# Patient Record
Sex: Female | Born: 1965 | Race: White | Hispanic: No | Smoking: Current some day smoker
Health system: Southern US, Community
[De-identification: ages and names within clinical notes are randomized; demographics above are authoritative.]

## PROBLEM LIST (undated history)

## (undated) DIAGNOSIS — F329 Major depressive disorder, single episode, unspecified: Secondary | ICD-10-CM

## (undated) DIAGNOSIS — F17200 Nicotine dependence, unspecified, uncomplicated: Secondary | ICD-10-CM

## (undated) DIAGNOSIS — Z87442 Personal history of urinary calculi: Secondary | ICD-10-CM

## (undated) DIAGNOSIS — F32A Depression, unspecified: Secondary | ICD-10-CM

## (undated) DIAGNOSIS — F419 Anxiety disorder, unspecified: Secondary | ICD-10-CM

## (undated) DIAGNOSIS — J189 Pneumonia, unspecified organism: Secondary | ICD-10-CM

## (undated) DIAGNOSIS — C801 Malignant (primary) neoplasm, unspecified: Secondary | ICD-10-CM

---
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
## 1998-08-09 ENCOUNTER — Other Ambulatory Visit: Admission: RE | Admit: 1998-08-09 | Discharge: 1998-08-09 | Payer: Self-pay | Admitting: Obstetrics & Gynecology

## 1999-08-20 ENCOUNTER — Other Ambulatory Visit: Admission: RE | Admit: 1999-08-20 | Discharge: 1999-08-20 | Payer: Self-pay | Admitting: Obstetrics & Gynecology

## 2000-09-30 ENCOUNTER — Other Ambulatory Visit: Admission: RE | Admit: 2000-09-30 | Discharge: 2000-09-30 | Payer: Self-pay | Admitting: Obstetrics & Gynecology

## 2001-11-27 ENCOUNTER — Other Ambulatory Visit: Admission: RE | Admit: 2001-11-27 | Discharge: 2001-11-27 | Payer: Self-pay | Admitting: Obstetrics & Gynecology

## 2002-12-07 ENCOUNTER — Other Ambulatory Visit: Admission: RE | Admit: 2002-12-07 | Discharge: 2002-12-07 | Payer: Self-pay | Admitting: Obstetrics & Gynecology

## 2004-01-18 ENCOUNTER — Other Ambulatory Visit: Admission: RE | Admit: 2004-01-18 | Discharge: 2004-01-18 | Payer: Self-pay | Admitting: Specialist

## 2004-01-18 ENCOUNTER — Other Ambulatory Visit: Admission: RE | Admit: 2004-01-18 | Discharge: 2004-01-18 | Payer: Self-pay | Admitting: Obstetrics & Gynecology

## 2004-07-26 ENCOUNTER — Other Ambulatory Visit: Admission: RE | Admit: 2004-07-26 | Discharge: 2004-07-26 | Payer: Self-pay | Admitting: Obstetrics & Gynecology

## 2005-03-12 ENCOUNTER — Other Ambulatory Visit: Admission: RE | Admit: 2005-03-12 | Discharge: 2005-03-12 | Payer: Self-pay | Admitting: Obstetrics & Gynecology

## 2006-11-18 HISTORY — PX: ABDOMINAL HYSTERECTOMY: SHX81

## 2006-11-18 HISTORY — PX: CHOLECYSTECTOMY: SHX55

## 2009-01-31 ENCOUNTER — Ambulatory Visit: Payer: Self-pay | Admitting: Psychology

## 2009-02-21 ENCOUNTER — Ambulatory Visit: Payer: Self-pay | Admitting: Psychology

## 2009-11-24 ENCOUNTER — Encounter: Admission: RE | Admit: 2009-11-24 | Discharge: 2009-11-24 | Payer: Self-pay | Admitting: Obstetrics & Gynecology

## 2010-05-15 ENCOUNTER — Ambulatory Visit (HOSPITAL_COMMUNITY): Admission: RE | Admit: 2010-05-15 | Discharge: 2010-05-15 | Payer: Self-pay | Admitting: Endocrinology

## 2011-02-03 LAB — BASIC METABOLIC PANEL
BUN: 5 mg/dL — ABNORMAL LOW (ref 6–23)
Calcium: 8.7 mg/dL (ref 8.4–10.5)
Chloride: 103 mEq/L (ref 96–112)
Creatinine, Ser: 0.65 mg/dL (ref 0.4–1.2)
Glucose, Bld: 93 mg/dL (ref 70–99)
Potassium: 3.6 mEq/L (ref 3.5–5.1)
Sodium: 138 mEq/L (ref 135–145)

## 2011-02-03 LAB — ACTH STIMULATION, 3 TIME POINTS
Cortisol, 30 Min: 13.8 ug/dL (ref >20)
Cortisol, 60 Min: 15.6 ug/dL (ref >20)

## 2012-01-14 ENCOUNTER — Other Ambulatory Visit: Payer: Self-pay | Admitting: Obstetrics & Gynecology

## 2012-01-14 DIAGNOSIS — R928 Other abnormal and inconclusive findings on diagnostic imaging of breast: Secondary | ICD-10-CM

## 2012-01-15 ENCOUNTER — Other Ambulatory Visit: Payer: Self-pay

## 2017-07-16 ENCOUNTER — Other Ambulatory Visit: Payer: Self-pay | Admitting: Obstetrics & Gynecology

## 2017-07-16 DIAGNOSIS — R928 Other abnormal and inconclusive findings on diagnostic imaging of breast: Secondary | ICD-10-CM

## 2017-07-18 ENCOUNTER — Ambulatory Visit
Admission: RE | Admit: 2017-07-18 | Discharge: 2017-07-18 | Disposition: A | Discharge status: Home / Self Care | Payer: Commercial Managed Care - PPO | Source: Ambulatory Visit | Attending: Obstetrics & Gynecology | Admitting: Obstetrics & Gynecology

## 2017-07-18 ENCOUNTER — Other Ambulatory Visit: Payer: Self-pay | Admitting: Obstetrics & Gynecology

## 2017-07-18 DIAGNOSIS — R928 Other abnormal and inconclusive findings on diagnostic imaging of breast: Secondary | ICD-10-CM

## 2017-08-15 DIAGNOSIS — Z170 Estrogen receptor positive status [ER+]: Secondary | ICD-10-CM | POA: Diagnosis not present

## 2017-08-15 DIAGNOSIS — C50419 Malignant neoplasm of upper-outer quadrant of unspecified female breast: Secondary | ICD-10-CM | POA: Insufficient documentation

## 2017-08-15 DIAGNOSIS — C50911 Malignant neoplasm of unspecified site of right female breast: Secondary | ICD-10-CM | POA: Diagnosis not present

## 2017-08-20 ENCOUNTER — Other Ambulatory Visit: Payer: Self-pay | Admitting: Surgery

## 2017-08-20 DIAGNOSIS — Z853 Personal history of malignant neoplasm of breast: Secondary | ICD-10-CM

## 2017-09-24 ENCOUNTER — Ambulatory Visit: Payer: Self-pay | Admitting: Plastic Surgery

## 2017-09-24 NOTE — H&P (Signed)
Jasmine [name] is an 51 y.o. female.   Chief Complaint: RIGHT breast cancer HPI: The patient is a 51 y.o. yrs old wf here for RIGHT breast reconstruction consult.  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 She underwent a lap choley in the past and is a smoker.  She has mild ptosis bilaterally.  The patient is 5 feet 2 inches tall, weight 137 pounds and preop bra = 36 D.   No past medical history on file.  No past surgical history on file.  Family History  Problem Relation Age of Onset  . Breast cancer Paternal Grandmother    Social History:  has no tobacco, alcohol, and drug history on file.  Allergies: Not on File  No medications prior to admission.    No results found for this or any previous visit (from the past 48 hour(s)). No results found.  Review of Systems  Constitutional: Negative.   HENT: Negative.   Eyes: Negative.   Respiratory: Negative.   Cardiovascular: Negative.   Gastrointestinal: Negative.  Negative for heartburn.  Genitourinary: Negative.   Musculoskeletal: Negative.   Skin: Negative.   Neurological: Negative.   Psychiatric/Behavioral: Negative.     There were no vitals taken for this visit. Physical Exam  Constitutional: She is oriented to person, place, and time. She appears well-developed and well-nourished.  HENT:  Head: Normocephalic and atraumatic.  Eyes: EOM are normal. Pupils are equal, round, and reactive to light.  Cardiovascular: Normal rate.  Respiratory: Effort normal.  GI: Soft. She exhibits no distension.  Neurological: She is alert and oriented to person, place, and time.  Skin: Skin is warm.  Psychiatric: She has a normal mood and affect. Her behavior is normal. Judgment and thought content normal.     Assessment/Plan Plan for right partial mastectomy with bilateral reduction for symmetry and contour.  Wallace Going,  DO 09/24/2017, 9:26 AM

## 2017-09-25 ENCOUNTER — Other Ambulatory Visit: Payer: Self-pay

## 2017-09-25 ENCOUNTER — Encounter (HOSPITAL_BASED_OUTPATIENT_CLINIC_OR_DEPARTMENT_OTHER): Payer: Self-pay | Admitting: *Deleted

## 2017-09-29 NOTE — Progress Notes (Signed)
Ensure pre surgery drink given with instructions to complete by [facility], hibiclens soap given with instructions, pt verbalized understanding.

## 2017-09-30 ENCOUNTER — Ambulatory Visit: Payer: Self-pay | Admitting: Plastic Surgery

## 2017-09-30 ENCOUNTER — Ambulatory Visit: Payer: Self-pay | Admitting: Physician Assistant

## 2017-09-30 DIAGNOSIS — C50919 Malignant neoplasm of unspecified site of unspecified female breast: Secondary | ICD-10-CM

## 2017-09-30 NOTE — Anesthesia Preprocedure Evaluation (Addendum)
Anesthesia Evaluation  Patient identified by MRN, date of birth, ID band Patient awake    Reviewed: Allergy & Precautions, NPO status , Patient's Chart, lab work & pertinent test results  Airway Mallampati: II  TM Distance: >3 FB Neck ROM: Full    Dental  (+) Dental Advisory Given   Pulmonary Current Smoker,    breath sounds clear to auscultation       Cardiovascular negative cardio ROS   Rhythm:Regular Rate:Normal     Neuro/Psych negative neurological ROS     GI/Hepatic negative GI ROS, Neg liver ROS,   Endo/Other  negative endocrine ROS  Renal/GU negative Renal ROS     Musculoskeletal   Abdominal   Peds  Hematology negative hematology ROS (+)   Anesthesia Other Findings   Reproductive/Obstetrics                           No results found for: WBC, HGB, HCT, MCV, PLT  Anesthesia Physical Anesthesia Plan  ASA: II  Anesthesia Plan: General   Post-op Pain Management:  Regional for Post-op pain   Induction: Intravenous  PONV Risk Score and Plan: 2 and Dexamethasone, Ondansetron and Treatment may vary due to age or medical condition  Airway Management Planned: Oral ETT  Additional Equipment:   Intra-op Plan:   Post-operative Plan: Extubation in OR  Informed Consent: I have reviewed the patients History and Physical, chart, labs and discussed the procedure including the risks, benefits and alternatives for the proposed anesthesia with the patient or authorized representative who has indicated his/her understanding and acceptance.   Dental advisory given  Plan Discussed with: CRNA  Anesthesia Plan Comments:        Anesthesia Quick Evaluation

## 2017-09-30 NOTE — H&P (Signed)
Jasmine [name] is an 51 y.o. female.   Chief Complaint: right breast cancer  HPI: this is a 51 year old with the recent diagnosis of a right breast cancer.  She was initially seen by my partner in [place] but after she requested bilateral mastectomies, she was transferred to my care and Dr. Marla Roe was asked to see the patient for immediate reconstruction.  She was found to have a right breast cancer on imaging and biopsy with extension to the nipple.  She has requested her other breast be removed.  She has no prior history of breast cancer.  She is otherwise well and without complaints  Past Medical History:  Diagnosis Date  . Anxiety     Past Surgical History:  Procedure Laterality Date  . ABDOMINAL HYSTERECTOMY  2008  . CHOLECYSTECTOMY  2008  . WISDOM TOOTH EXTRACTION  1984    Family History  Problem Relation Age of Onset  . Breast cancer Paternal Grandmother    Social History:  reports that she has been smoking.  She has been smoking about 1.00 pack per day. she has never used smokeless tobacco. She reports that she does not drink alcohol or use drugs.  Allergies: No Known Allergies  No medications prior to admission.    No results found for this or any previous visit (from the past 48 hour(s)). No results found.  Review of Systems  All other systems reviewed and are negative.   Height 5\' 2"  (1.575 m), weight 63.5 kg (140 lb). Physical Exam  Constitutional: She is oriented to person, place, and time. She appears well-developed and well-nourished.  HENT:  Head: Normocephalic.  Right Ear: External ear normal.  Left Ear: External ear normal.  Eyes: Conjunctivae are normal. Pupils are equal, round, and reactive to light. No scleral icterus.  Neck: Normal range of motion. Neck supple.  Cardiovascular: Normal rate, regular rhythm, normal heart sounds and intact distal pulses.  No murmur heard. Respiratory: Effort normal and breath sounds normal. She exhibits no  tenderness.  GI: Soft. Bowel sounds are normal. There is no tenderness.  Musculoskeletal: Normal range of motion. She exhibits no edema.  Neurological: She is alert and oriented to person, place, and time.  Skin: Skin is warm. She is not diaphoretic. No erythema.  Psychiatric: Her behavior is normal. Judgment normal.   breast:  No palpable masses  Assessment/Plan Right breast cancer  We will proceed with a right breast mastectomy and right deep axillary sentinel lymph node biopsy as well as a left mastectomy.  She will also undergo immediate reconstruction by Plastic Surgery.  We discussed the risks which include but are not limited to bleeding, infection, the need for further surgery if nodes are positive, injury to surrounding structures, DVT, post op recovery, etc.  She agrees to proceed.  Harl Bowie, MD 09/30/2017, 5:55 PM

## 2017-10-01 ENCOUNTER — Encounter (HOSPITAL_BASED_OUTPATIENT_CLINIC_OR_DEPARTMENT_OTHER)
Admission: RE | Disposition: A | Discharge status: Home / Self Care | Payer: Self-pay | Source: Ambulatory Visit | Attending: Plastic Surgery

## 2017-10-01 ENCOUNTER — Ambulatory Visit (HOSPITAL_BASED_OUTPATIENT_CLINIC_OR_DEPARTMENT_OTHER): Payer: Commercial Managed Care - PPO | Admitting: Certified Registered"

## 2017-10-01 ENCOUNTER — Ambulatory Visit (HOSPITAL_BASED_OUTPATIENT_CLINIC_OR_DEPARTMENT_OTHER)
Admission: RE | Admit: 2017-10-01 | Discharge: 2017-10-02 | Disposition: A | Discharge status: Home / Self Care | Payer: Commercial Managed Care - PPO | Source: Ambulatory Visit | Attending: Plastic Surgery | Admitting: Plastic Surgery

## 2017-10-01 ENCOUNTER — Encounter (HOSPITAL_BASED_OUTPATIENT_CLINIC_OR_DEPARTMENT_OTHER): Payer: Self-pay

## 2017-10-01 ENCOUNTER — Other Ambulatory Visit: Payer: Self-pay

## 2017-10-01 ENCOUNTER — Ambulatory Visit (HOSPITAL_COMMUNITY)
Admission: RE | Admit: 2017-10-01 | Discharge: 2017-10-01 | Disposition: A | Discharge status: Home / Self Care | Payer: Commercial Managed Care - PPO | Source: Ambulatory Visit | Attending: Surgery | Admitting: Surgery

## 2017-10-01 DIAGNOSIS — F419 Anxiety disorder, unspecified: Secondary | ICD-10-CM | POA: Diagnosis not present

## 2017-10-01 DIAGNOSIS — Z9013 Acquired absence of bilateral breasts and nipples: Secondary | ICD-10-CM | POA: Insufficient documentation

## 2017-10-01 DIAGNOSIS — F17210 Nicotine dependence, cigarettes, uncomplicated: Secondary | ICD-10-CM | POA: Insufficient documentation

## 2017-10-01 DIAGNOSIS — Z853 Personal history of malignant neoplasm of breast: Secondary | ICD-10-CM

## 2017-10-01 DIAGNOSIS — C50911 Malignant neoplasm of unspecified site of right female breast: Secondary | ICD-10-CM | POA: Diagnosis not present

## 2017-10-01 DIAGNOSIS — Z170 Estrogen receptor positive status [ER+]: Secondary | ICD-10-CM | POA: Diagnosis not present

## 2017-10-01 DIAGNOSIS — C50919 Malignant neoplasm of unspecified site of unspecified female breast: Secondary | ICD-10-CM

## 2017-10-01 HISTORY — PX: MASTECTOMY W/ SENTINEL NODE BIOPSY: SHX2001

## 2017-10-01 HISTORY — DX: Anxiety disorder, unspecified: F41.9

## 2017-10-01 HISTORY — PX: BREAST RECONSTRUCTION WITH PLACEMENT OF TISSUE EXPANDER AND FLEX HD (ACELLULAR HYDRATED DERMIS): SHX6295

## 2017-10-01 HISTORY — PX: SIMPLE MASTECTOMY WITH AXILLARY SENTINEL NODE BIOPSY: SHX6098

## 2017-10-01 SURGERY — MASTECTOMY WITH SENTINEL LYMPH NODE BIOPSY
Anesthesia: General | Site: Breast | Laterality: Right

## 2017-10-01 MED ORDER — BUPIVACAINE-EPINEPHRINE (PF) 0.5% -1:200000 IJ SOLN
INTRAMUSCULAR | Status: AC
  Filled 2017-10-01: qty 30

## 2017-10-01 MED ORDER — CEFAZOLIN SODIUM-DEXTROSE 2-4 GM/100ML-% IV SOLN: 2.0000 g | INTRAVENOUS | Status: DC

## 2017-10-01 MED ORDER — FENTANYL CITRATE (PF) 100 MCG/2ML IJ SOLN
INTRAMUSCULAR | Status: AC
  Filled 2017-10-01: qty 2

## 2017-10-01 MED ORDER — LACTATED RINGERS IV SOLN
INTRAVENOUS | Status: DC
  Administered 2017-10-01 (×2): via INTRAVENOUS

## 2017-10-01 MED ORDER — SENNA 8.6 MG PO TABS
1.0000 | ORAL_TABLET | Freq: Two times a day (BID) | ORAL | Status: DC
  Administered 2017-10-01: 8.6 mg via ORAL
  Filled 2017-10-01: qty 1

## 2017-10-01 MED ORDER — FENTANYL CITRATE (PF) 100 MCG/2ML IJ SOLN
25.0000 ug | INTRAMUSCULAR | Status: DC | PRN
  Administered 2017-10-01 (×2): 50 ug via INTRAVENOUS

## 2017-10-01 MED ORDER — SUCCINYLCHOLINE CHLORIDE 200 MG/10ML IV SOSY
PREFILLED_SYRINGE | INTRAVENOUS | Status: AC
  Filled 2017-10-01: qty 10

## 2017-10-01 MED ORDER — ONDANSETRON 4 MG PO TBDP: 4.0000 mg | ORAL_TABLET | Freq: Four times a day (QID) | ORAL | Status: DC | PRN

## 2017-10-01 MED ORDER — CHLORHEXIDINE GLUCONATE CLOTH 2 % EX PADS: 6.0000 | MEDICATED_PAD | Freq: Once | CUTANEOUS | Status: DC

## 2017-10-01 MED ORDER — DEXAMETHASONE SODIUM PHOSPHATE 10 MG/ML IJ SOLN
INTRAMUSCULAR | Status: AC
  Filled 2017-10-01: qty 1

## 2017-10-01 MED ORDER — SUGAMMADEX SODIUM 200 MG/2ML IV SOLN
INTRAVENOUS | Status: AC
  Filled 2017-10-01: qty 2

## 2017-10-01 MED ORDER — MIDAZOLAM HCL 2 MG/2ML IJ SOLN
INTRAMUSCULAR | Status: AC
  Filled 2017-10-01: qty 2

## 2017-10-01 MED ORDER — HYDROCODONE-ACETAMINOPHEN 5-325 MG PO TABS
1.0000 | ORAL_TABLET | ORAL | Status: DC | PRN
  Administered 2017-10-01 (×4): 1 via ORAL
  Administered 2017-10-02 (×2): 2 via ORAL
  Filled 2017-10-01: qty 2
  Filled 2017-10-01: qty 1
  Filled 2017-10-01 (×2): qty 2
  Filled 2017-10-01: qty 1

## 2017-10-01 MED ORDER — BUPIVACAINE-EPINEPHRINE (PF) 0.25% -1:200000 IJ SOLN
INTRAMUSCULAR | Status: DC | PRN
  Administered 2017-10-01 (×2): 30 mL

## 2017-10-01 MED ORDER — METHYLENE BLUE 0.5 % INJ SOLN
INTRAVENOUS | Status: AC
  Filled 2017-10-01: qty 10

## 2017-10-01 MED ORDER — KCL IN DEXTROSE-NACL 20-5-0.45 MEQ/L-%-% IV SOLN
INTRAVENOUS | Status: DC
  Administered 2017-10-01: 16:00:00 via INTRAVENOUS
  Filled 2017-10-01: qty 1000

## 2017-10-01 MED ORDER — SCOPOLAMINE 1 MG/3DAYS TD PT72: 1.0000 | MEDICATED_PATCH | Freq: Once | TRANSDERMAL | Status: DC | PRN

## 2017-10-01 MED ORDER — LIDOCAINE 2% (20 MG/ML) 5 ML SYRINGE
INTRAMUSCULAR | Status: DC | PRN
  Administered 2017-10-01: 20 mg via INTRAVENOUS

## 2017-10-01 MED ORDER — ONDANSETRON HCL 4 MG/2ML IJ SOLN: 4.0000 mg | Freq: Four times a day (QID) | INTRAMUSCULAR | Status: DC | PRN

## 2017-10-01 MED ORDER — CEFAZOLIN SODIUM-DEXTROSE 2-4 GM/100ML-% IV SOLN
2.0000 g | Freq: Three times a day (TID) | INTRAVENOUS | Status: DC
  Administered 2017-10-01 – 2017-10-02 (×2): 2 g via INTRAVENOUS
  Filled 2017-10-01 (×2): qty 100

## 2017-10-01 MED ORDER — ONDANSETRON HCL 4 MG/2ML IJ SOLN
INTRAMUSCULAR | Status: DC | PRN
  Administered 2017-10-01: 4 mg via INTRAVENOUS

## 2017-10-01 MED ORDER — ONDANSETRON HCL 4 MG/2ML IJ SOLN
INTRAMUSCULAR | Status: AC
  Filled 2017-10-01: qty 2

## 2017-10-01 MED ORDER — PROPOFOL 10 MG/ML IV BOLUS
INTRAVENOUS | Status: DC | PRN
  Administered 2017-10-01: 50 mg via INTRAVENOUS
  Administered 2017-10-01: 150 mg via INTRAVENOUS

## 2017-10-01 MED ORDER — SUFENTANIL CITRATE 50 MCG/ML IV SOLN
INTRAVENOUS | Status: AC
  Filled 2017-10-01: qty 1

## 2017-10-01 MED ORDER — POLYETHYLENE GLYCOL 3350 17 G PO PACK: 17.0000 g | PACK | Freq: Every day | ORAL | Status: DC | PRN

## 2017-10-01 MED ORDER — ROCURONIUM BROMIDE 10 MG/ML (PF) SYRINGE
PREFILLED_SYRINGE | INTRAVENOUS | Status: AC
  Filled 2017-10-01: qty 5

## 2017-10-01 MED ORDER — HYDROMORPHONE HCL 1 MG/ML IJ SOLN: 1.0000 mg | INTRAMUSCULAR | Status: DC | PRN

## 2017-10-01 MED ORDER — MIDAZOLAM HCL 2 MG/2ML IJ SOLN
1.0000 mg | INTRAMUSCULAR | Status: DC | PRN
  Administered 2017-10-01 (×2): 2 mg via INTRAVENOUS

## 2017-10-01 MED ORDER — ALPRAZOLAM 0.5 MG PO TABS: 0.5000 mg | ORAL_TABLET | Freq: Every evening | ORAL | Status: DC | PRN

## 2017-10-01 MED ORDER — EPHEDRINE SULFATE 50 MG/ML IJ SOLN
INTRAMUSCULAR | Status: DC | PRN
  Administered 2017-10-01: 10 mg via INTRAVENOUS
  Administered 2017-10-01: 50 mg via INTRAVENOUS

## 2017-10-01 MED ORDER — TECHNETIUM TC 99M SULFUR COLLOID FILTERED
1.0000 | Freq: Once | INTRAVENOUS | Status: AC | PRN
  Administered 2017-10-01: 1 via INTRADERMAL

## 2017-10-01 MED ORDER — DIPHENHYDRAMINE HCL 12.5 MG/5ML PO ELIX: 12.5000 mg | ORAL_SOLUTION | Freq: Four times a day (QID) | ORAL | Status: DC | PRN

## 2017-10-01 MED ORDER — FENTANYL CITRATE (PF) 100 MCG/2ML IJ SOLN
50.0000 ug | INTRAMUSCULAR | Status: AC | PRN
  Administered 2017-10-01 (×4): 50 ug via INTRAVENOUS

## 2017-10-01 MED ORDER — PROMETHAZINE HCL 25 MG/ML IJ SOLN: 6.2500 mg | INTRAMUSCULAR | Status: DC | PRN

## 2017-10-01 MED ORDER — SUCCINYLCHOLINE CHLORIDE 20 MG/ML IJ SOLN
INTRAMUSCULAR | Status: DC | PRN
  Administered 2017-10-01: 80 mg via INTRAVENOUS

## 2017-10-01 MED ORDER — METHYLENE BLUE 0.5 % INJ SOLN
INTRAVENOUS | Status: DC | PRN
  Administered 2017-10-01: 2 mL via SUBMUCOSAL

## 2017-10-01 MED ORDER — NAPROXEN 500 MG PO TABS: 500.0000 mg | ORAL_TABLET | Freq: Two times a day (BID) | ORAL | Status: DC | PRN

## 2017-10-01 MED ORDER — LIDOCAINE 2% (20 MG/ML) 5 ML SYRINGE
INTRAMUSCULAR | Status: AC
  Filled 2017-10-01: qty 5

## 2017-10-01 MED ORDER — DIAZEPAM 2 MG PO TABS
2.0000 mg | ORAL_TABLET | Freq: Two times a day (BID) | ORAL | Status: DC | PRN
  Administered 2017-10-01: 2 mg via ORAL
  Filled 2017-10-01: qty 1

## 2017-10-01 MED ORDER — PHENYLEPHRINE 40 MCG/ML (10ML) SYRINGE FOR IV PUSH (FOR BLOOD PRESSURE SUPPORT)
PREFILLED_SYRINGE | INTRAVENOUS | Status: AC
  Filled 2017-10-01: qty 10

## 2017-10-01 MED ORDER — SODIUM CHLORIDE 0.9 % IV SOLN
INTRAVENOUS | Status: DC | PRN
  Administered 2017-10-01: 500 mL

## 2017-10-01 MED ORDER — DEXAMETHASONE SODIUM PHOSPHATE 4 MG/ML IJ SOLN
INTRAMUSCULAR | Status: DC | PRN
  Administered 2017-10-01: 10 mg via INTRAVENOUS

## 2017-10-01 MED ORDER — SODIUM CHLORIDE 0.9 % IJ SOLN
INTRAMUSCULAR | Status: AC
  Filled 2017-10-01: qty 10

## 2017-10-01 MED ORDER — DIPHENHYDRAMINE HCL 50 MG/ML IJ SOLN: 12.5000 mg | Freq: Four times a day (QID) | INTRAMUSCULAR | Status: DC | PRN

## 2017-10-01 MED ORDER — CEFAZOLIN SODIUM-DEXTROSE 2-4 GM/100ML-% IV SOLN
2.0000 g | INTRAVENOUS | Status: AC
  Administered 2017-10-01: 2 g via INTRAVENOUS

## 2017-10-01 MED ORDER — MIDAZOLAM HCL 2 MG/2ML IJ SOLN
INTRAMUSCULAR | Status: AC
Start: 2017-10-01 — End: 2017-10-01
  Filled 2017-10-01: qty 2

## 2017-10-01 MED ORDER — SODIUM CHLORIDE 0.9 % IJ SOLN
INTRAMUSCULAR | Status: DC | PRN
  Administered 2017-10-01: 3 mL via INTRAVENOUS

## 2017-10-01 MED ORDER — CEFAZOLIN SODIUM-DEXTROSE 2-4 GM/100ML-% IV SOLN
INTRAVENOUS | Status: AC
  Filled 2017-10-01: qty 100

## 2017-10-01 MED ORDER — EPHEDRINE 5 MG/ML INJ
INTRAVENOUS | Status: AC
  Filled 2017-10-01: qty 10

## 2017-10-01 SURGICAL SUPPLY — 79 items
APPLIER CLIP 9.375 MED OPEN (MISCELLANEOUS) ×6
BAG DECANTER FOR FLEXI CONT (MISCELLANEOUS) ×6 IMPLANT
BINDER BREAST LRG (GAUZE/BANDAGES/DRESSINGS) ×6 IMPLANT
BINDER BREAST MEDIUM (GAUZE/BANDAGES/DRESSINGS) IMPLANT
BINDER BREAST XLRG (GAUZE/BANDAGES/DRESSINGS) IMPLANT
BINDER BREAST XXLRG (GAUZE/BANDAGES/DRESSINGS) IMPLANT
BIOPATCH RED 1 DISK 7.0 (GAUZE/BANDAGES/DRESSINGS) ×5 IMPLANT
BIOPATCH RED 1IN DISK 7.0MM (GAUZE/BANDAGES/DRESSINGS) ×1
BLADE CLIPPER SURG (BLADE) IMPLANT
BLADE HEX COATED 2.75 (ELECTRODE) ×6 IMPLANT
BLADE SURG 15 STRL LF DISP TIS (BLADE) ×4 IMPLANT
BLADE SURG 15 STRL SS (BLADE) ×3
BNDG GAUZE ELAST 4 BULKY (GAUZE/BANDAGES/DRESSINGS) ×12 IMPLANT
CANISTER SUCT 1200ML W/VALVE (MISCELLANEOUS) ×6 IMPLANT
CHLORAPREP W/TINT 26ML (MISCELLANEOUS) ×6 IMPLANT
CLIP APPLIE 9.375 MED OPEN (MISCELLANEOUS) ×4 IMPLANT
COVER BACK TABLE 60X90IN (DRAPES) ×6 IMPLANT
COVER MAYO STAND STRL (DRAPES) ×6 IMPLANT
COVER PROBE W GEL 5X96 (DRAPES) ×6 IMPLANT
DECANTER SPIKE VIAL GLASS SM (MISCELLANEOUS) ×6 IMPLANT
DERMABOND ADVANCED (GAUZE/BANDAGES/DRESSINGS) ×4
DERMABOND ADVANCED .7 DNX12 (GAUZE/BANDAGES/DRESSINGS) ×8 IMPLANT
DRAIN CHANNEL 19F RND (DRAIN) ×6 IMPLANT
DRAPE LAPAROSCOPIC ABDOMINAL (DRAPES) ×6 IMPLANT
DRAPE UTILITY XL STRL (DRAPES) ×6 IMPLANT
DRSG PAD ABDOMINAL 8X10 ST (GAUZE/BANDAGES/DRESSINGS) ×12 IMPLANT
ELECT BLADE 4.0 EZ CLEAN MEGAD (MISCELLANEOUS) ×6
ELECT REM PT RETURN 9FT ADLT (ELECTROSURGICAL) ×6
ELECTRODE BLDE 4.0 EZ CLN MEGD (MISCELLANEOUS) ×4 IMPLANT
ELECTRODE REM PT RTRN 9FT ADLT (ELECTROSURGICAL) ×4 IMPLANT
EVACUATOR SILICONE 100CC (DRAIN) ×6 IMPLANT
GAUZE SPONGE 4X4 12PLY STRL LF (GAUZE/BANDAGES/DRESSINGS) IMPLANT
GLOVE BIO SURGEON STRL SZ 6.5 (GLOVE) ×10 IMPLANT
GLOVE BIO SURGEONS STRL SZ 6.5 (GLOVE) ×2
GLOVE SURG SIGNA 7.5 PF LTX (GLOVE) ×6 IMPLANT
GOWN STRL REUS W/ TWL LRG LVL3 (GOWN DISPOSABLE) ×12 IMPLANT
GOWN STRL REUS W/ TWL XL LVL3 (GOWN DISPOSABLE) ×4 IMPLANT
GOWN STRL REUS W/TWL LRG LVL3 (GOWN DISPOSABLE) ×6
GOWN STRL REUS W/TWL XL LVL3 (GOWN DISPOSABLE) ×3
GRAFT FLEX HD 4X16 THICK (Tissue Mesh) ×12 IMPLANT
HEMOSTAT SURGICEL 2X14 (HEMOSTASIS) IMPLANT
IMPL EXPANDER BREAST 375CC (Breast) ×8 IMPLANT
IMPLANT BREAST 375CC (Breast) ×4 IMPLANT
IMPLANT EXPANDER BREAST 375CC (Breast) ×8 IMPLANT
IV NS 1000ML (IV SOLUTION)
IV NS 1000ML BAXH (IV SOLUTION) IMPLANT
IV NS 500ML (IV SOLUTION) ×2
IV NS 500ML BAXH (IV SOLUTION) ×4 IMPLANT
KIT FILL SYSTEM UNIVERSAL (SET/KITS/TRAYS/PACK) IMPLANT
NDL SAFETY ECLIPSE 18X1.5 (NEEDLE) ×4 IMPLANT
NEEDLE HYPO 18GX1.5 SHARP (NEEDLE) ×2
NEEDLE HYPO 25X1 1.5 SAFETY (NEEDLE) ×12 IMPLANT
NS IRRIG 1000ML POUR BTL (IV SOLUTION) ×6 IMPLANT
PACK BASIN DAY SURGERY FS (CUSTOM PROCEDURE TRAY) ×6 IMPLANT
PENCIL BUTTON HOLSTER BLD 10FT (ELECTRODE) ×12 IMPLANT
PIN SAFETY STERILE (MISCELLANEOUS) ×6 IMPLANT
SET ASEPTIC TRANSFER (MISCELLANEOUS) ×12 IMPLANT
SLEEVE SCD COMPRESS KNEE MED (MISCELLANEOUS) ×6 IMPLANT
SPONGE LAP 18X18 X RAY DECT (DISPOSABLE) ×12 IMPLANT
SUT ETHILON 3 0 PS 1 (SUTURE) ×6 IMPLANT
SUT MNCRL AB 4-0 PS2 18 (SUTURE) ×6 IMPLANT
SUT MON AB 3-0 SH 27 (SUTURE) ×4
SUT MON AB 3-0 SH27 (SUTURE) ×4 IMPLANT
SUT MON AB 5-0 PS2 18 (SUTURE) ×12 IMPLANT
SUT PDS 3-0 CT2 (SUTURE)
SUT PDS AB 2-0 CT2 27 (SUTURE) ×18 IMPLANT
SUT PDS II 3-0 CT2 27 ABS (SUTURE) IMPLANT
SUT SILK 3 0 PS 1 (SUTURE) ×6 IMPLANT
SUT VIC AB 3-0 SH 27 (SUTURE) ×3
SUT VIC AB 3-0 SH 27X BRD (SUTURE) ×4 IMPLANT
SYR BULB 3OZ (MISCELLANEOUS) ×6 IMPLANT
SYR BULB IRRIGATION 50ML (SYRINGE) ×6 IMPLANT
SYR CONTROL 10ML LL (SYRINGE) ×12 IMPLANT
TOWEL OR 17X24 6PK STRL BLUE (TOWEL DISPOSABLE) ×12 IMPLANT
TOWEL OR NON WOVEN STRL DISP B (DISPOSABLE) ×6 IMPLANT
TUBE CONNECTING 20'X1/4 (TUBING) ×1
TUBE CONNECTING 20X1/4 (TUBING) ×5 IMPLANT
UNDERPAD 30X30 (UNDERPADS AND DIAPERS) ×12 IMPLANT
YANKAUER SUCT BULB TIP NO VENT (SUCTIONS) ×12 IMPLANT

## 2017-10-01 NOTE — Interval H&P Note (Signed)
History and Physical Interval Note:  10/01/2017 11:36 AM  Jasmine [name]  has presented today for surgery, with the diagnosis of RIGHT BREAST CANCER  The various methods of treatment have been discussed with the patient and family. After consideration of risks, benefits and other options for treatment, the patient has consented to  Procedure(s): RIGHT  MASTECTOMY WITH RIGHT SENTINEL LYMPH NODE BIOPSY (Right) LEFT NIPPLE SPARING MASTECTOMY (Left) BILATERAL BREAST RECONSTRUCTION WITH PLACEMENT OF TISSUE EXPANDER AND FLEX HD (ACELLULAR HYDRATED DERMIS) (Bilateral) as a surgical intervention .  The patient's history has been reviewed, patient examined, no change in status, stable for surgery.  I have reviewed the patient's chart and labs.  Questions were answered to the patient's satisfaction.     Wallace Going

## 2017-10-01 NOTE — Op Note (Signed)
Op report    DATE OF OPERATION:  10/01/2017  LOCATION: [place]  SURGICAL DIVISION: Plastic Surgery  PREOPERATIVE DIAGNOSES:  1. RIGHT Breast cancer.    POSTOPERATIVE DIAGNOSES:  1. RIGHT Breast cancer.   PROCEDURE:  1. Bilateral immediate breast reconstruction with placement of Acellular Dermal Matrix and tissue expanders.  SURGEON: Pantera Winterrowd Sanger Benjamine Strout, DO  ANESTHESIA:  General.   COMPLICATIONS: None.   IMPLANTS: Left - Mentor 375cc. Ref # [id number].  Serial Number [id number]; 200 cc saline injected Right - Mentor 375cc. Ref # [id number].  Serial Number [id number]; 200 cc saline injected Acellular Dermal Matrix 4 x 16 cm two  INDICATIONS FOR PROCEDURE:  The patient, Jasmine [name], is a 51 y.o. female born on 1966/10/06, is here for  immediate first stage breast reconstruction with placement of bilateral tissue expander and Acellular dermal matrix. MRN: [id number]  CONSENT:  Informed consent was obtained directly from the patient. Risks, benefits and alternatives were fully discussed. Specific risks including but not limited to bleeding, infection, hematoma, seroma, scarring, pain, implant infection, implant extrusion, capsular contracture, asymmetry, wound healing problems, and need for further surgery were all discussed. The patient did have an ample opportunity to have her questions answered to her satisfaction.   DESCRIPTION OF PROCEDURE:  The patient was taken to the operating room by the general surgery team. SCDs were placed and IV antibiotics were given. The patient's chest was prepped and draped in a sterile fashion. A time out was performed and the implants to be used were identified.  Bilateral mastectomies were performed.  Once the general surgery team had completed their portion of the case the patient was rendered to the plastic and reconstructive surgery team.  RIght:  The pectoralis major muscle was lifted from the chest wall  with release of the lateral edge and lateral inframammary fold.  The pocket was irrigated with antibiotic solution and hemostasis was achieved with electrocautery.  The ADM was then prepared according to the manufacture guidelines and slits placed to help with postoperative fluid management.  The ADM was then sutured to the inferior and lateral edge of the inframammary fold with 2-0 PDS starting with an interrupted stitch and then a running stitch.  The lateral portion was sutured to with interrupted sutures after the expander was placed.  The expander was prepared according to the manufacture guidelines, the air evacuated and then it was placed under the ADM and pectoralis major muscle.  The inferior and lateral tabs were used to secure the expander to the chest wall with 2-0 PDS.  The drain was placed at the inframammary fold over the ADM and secured to the skin with 3-0 Silk.  The deep layers were closed with 3-0 Monocryl followed by 4-0 Monocryl.  The skin was closed with 5-0 Monocryl and then dermabond was applied.   Left: The pectoralis major muscle was lifted from the chest wall with release of the lateral edge and lateral inframammary fold.  The superior flap was particularly thick and therefore 1 cm of fat thickness excised and sent.  The pocket was irrigated with antibiotic solution and hemostasis was achieved with electrocautery.  The ADM was then prepared according to the manufacture guidelines and slits placed to help with postoperative fluid management.  The ADM was then sutured to the inferior and lateral edge of the inframammary fold with 2-0 PDS starting with an interrupted stitch and then a running stitch.  The lateral portion was sutured to with interrupted sutures  after the expander was placed.  The expander was prepared according to the manufacture guidelines, the air evacuated and then it was placed under the ADM and pectoralis major muscle.  The inferior and lateral tabs were used to secure  the expander to the chest wall with 2-0 PDS.  The drain was placed at the inframammary fold over the ADM and secured to the skin with 3-0 Silk.  The deep layers were closed with 3-0 Monocryl followed by 4-0 Monocryl.  The skin was closed with 5-0 Monocryl and then dermabond was applied.   The ABDs and breast binder were placed.  The patient tolerated the procedure well and there were no complications.  The patient was allowed to wake from anesthesia and taken to the recovery room in satisfactory condition.

## 2017-10-01 NOTE — Anesthesia Procedure Notes (Signed)
Anesthesia Regional Block: Pectoralis block   Pre-Anesthetic Checklist: ,, timeout performed, Correct Patient, Correct Site, Correct Laterality, Correct Procedure, Correct Position, site marked, Risks and benefits discussed,  Surgical consent,  Pre-op evaluation,  At surgeon's request and post-op pain management  Laterality: Right  Prep: chloraprep       Needles:  Injection technique: Single-shot  Needle Type: Echogenic Needle     Needle Length: 9cm  Needle Gauge: 21     Additional Needles:   Procedures:,,,, ultrasound used (permanent image in chart),,,,  Narrative:  Start time: 10/01/2017 10:10 AM End time: 10/01/2017 10:16 AM Injection made incrementally with aspirations every 5 mL.  Performed by: Personally  Anesthesiologist: Suzette Battiest, MD

## 2017-10-01 NOTE — Interval H&P Note (Signed)
History and Physical Interval Note:no change in H and P  10/01/2017 10:03 AM  Jasmine [name]  has presented today for surgery, with the diagnosis of RIGHT BREAST CANCER  The various methods of treatment have been discussed with the patient and family. After consideration of risks, benefits and other options for treatment, the patient has consented to  Procedure(s): RIGHT  MASTECTOMY WITH RIGHT SENTINEL LYMPH NODE BIOPSY (Right) LEFT NIPPLE SPARING MASTECTOMY (Left) BILATERAL BREAST RECONSTRUCTION WITH PLACEMENT OF TISSUE EXPANDER AND FLEX HD (ACELLULAR HYDRATED DERMIS) (Bilateral) as a surgical intervention .  The patient's history has been reviewed, patient examined, no change in status, stable for surgery.  I have reviewed the patient's chart and labs.  Questions were answered to the patient's satisfaction.     Elijio Staples A

## 2017-10-01 NOTE — Transfer of Care (Signed)
Immediate Anesthesia Transfer of Care Note  Patient: Jasmine [name]  Procedure(s) Performed: RIGHT  MASTECTOMY WITH RIGHT SENTINEL LYMPH NODE BIOPSY (Right Breast) Left Mastectomy (Left Breast) BILATERAL BREAST RECONSTRUCTION WITH PLACEMENT OF TISSUE EXPANDER AND FLEX HD (ACELLULAR HYDRATED DERMIS) (Bilateral Breast)  Patient Location: PACU  Anesthesia Type:General  Level of Consciousness: awake, alert , oriented and drowsy  Airway & Oxygen Therapy: Patient Spontanous Breathing and Patient connected to face mask oxygen  Post-op Assessment: Report given to RN and Post -op Vital signs reviewed and stable  Post vital signs: Reviewed and stable  Last Vitals:  Vitals:   10/01/17 1030 10/01/17 1045  BP: 101/61 (!) 107/58  Pulse: 80 80  Resp: 19 (!) 24  Temp:    SpO2: 100% 100%    Last Pain:  Vitals:   10/01/17 0928  TempSrc: Oral         Complications: No apparent anesthesia complications

## 2017-10-01 NOTE — Progress Notes (Signed)
Assisted Dr. Oren Bracket with right, left, ultrasound guided, pectoralis block and  nuc med tech  With nuc med inj. Side rails up, monitors on throughout procedure. See vital signs in flow sheet. Tolerated Procedure well.

## 2017-10-01 NOTE — Anesthesia Procedure Notes (Signed)
Anesthesia Regional Block: Pectoralis block   Pre-Anesthetic Checklist: ,, timeout performed, Correct Patient, Correct Site, Correct Laterality, Correct Procedure, Correct Position, site marked, Risks and benefits discussed,  Surgical consent,  Pre-op evaluation,  At surgeon's request and post-op pain management  Laterality: Left  Prep: chloraprep       Needles:  Injection technique: Single-shot  Needle Type: Echogenic Needle     Needle Length: 9cm  Needle Gauge: 21     Additional Needles:   Procedures:,,,, ultrasound used (permanent image in chart),,,,  Narrative:  Start time: 10/01/2017 10:03 AM End time: 10/01/2017 10:10 AM Injection made incrementally with aspirations every 5 mL.  Performed by: Personally  Anesthesiologist: Suzette Battiest, MD

## 2017-10-01 NOTE — Discharge Instructions (Signed)
Post Anesthesia Home Care Instructions  Activity: Get plenty of rest for the remainder of the day. A responsible individual must stay with you for 24 hours following the procedure.  For the next 24 hours, DO NOT: -Drive a car -Paediatric nurse -Drink alcoholic beverages -Take any medication unless instructed by your physician -Make any legal decisions or sign important papers.  Meals: Start with liquid foods such as gelatin or soup. Progress to regular foods as tolerated. Avoid greasy, spicy, heavy foods. If nausea and/or vomiting occur, drink only clear liquids until the nausea and/or vomiting subsides. Call your physician if vomiting continues.  Special Instructions/Symptoms: Your throat may feel dry or sore from the anesthesia or the breathing tube placed in your throat during surgery. If this causes discomfort, gargle with warm salt water. The discomfort should disappear within 24 hours.  If you had a scopolamine patch placed behind your ear for the management of post- operative nausea and/or vomiting:  1. The medication in the patch is effective for 72 hours, after which it should be removed.  Wrap patch in a tissue and discard in the trash. Wash hands thoroughly with soap and water. 2. You may remove the patch earlier than 72 hours if you experience unpleasant side effects which may include dry mouth, dizziness or visual disturbances. 3. Avoid touching the patch. Wash your hands with soap and water after contact with the patch.   Regional Anesthesia Blocks  1. Numbness or the inability to move the "blocked" extremity may last from 3-48 hours after placement. The length of time depends on the medication injected and your individual response to the medication. If the numbness is not going away after 48 hours, call your surgeon.  2. The extremity that is blocked will need to be protected until the numbness is gone and the  Strength has returned. Because you cannot feel it, you will  need to take extra care to avoid injury. Because it may be weak, you may have difficulty moving it or using it. You may not know what position it is in without looking at it while the block is in effect.  3. For blocks in the legs and feet, returning to weight bearing and walking needs to be done carefully. You will need to wait until the numbness is entirely gone and the strength has returned. You should be able to move your leg and foot normally before you try and bear weight or walk. You will need someone to be with you when you first try to ensure you do not fall and possibly risk injury.  4. Bruising and tenderness at the needle site are common side effects and will resolve in a few days.  5. Persistent numbness or new problems with movement should be communicated to the surgeon or the [place] [phone] [place] [phone]).    JP Drain [employer] this sheet to all of your post-operative appointments while you have your drains.  Please measure your drains by CC's or ML's.  Make sure you drain and measure your JP Drains 2 or 3 times per day.  At the end of each day, add up totals for the left side and add up totals for the right side.    ( 9 am )     ( 3 pm )        ( 9 pm )                Date  L  R  L  R  L  R  Total L/R

## 2017-10-01 NOTE — Op Note (Signed)
RIGHT  MASTECTOMY WITH RIGHT SENTINEL LYMPH NODE BIOPSY, LEFT NIPPLE SPARING MASTECTOMY  Procedure Note  Jasmine [name] 10/01/2017   Pre-op Diagnosis: RIGHT BREAST CANCER     Post-op Diagnosis: same  Procedure(s): RIGHT  MASTECTOMY WITH RIGHT DEEP AXILLARY SENTINEL LYMPH NODE BIOPSY. INJECTION OF BLUE DYE LEFT NIPPLE SPARING MASTECTOMY   Surgeon(s): Jasmine Keens, MD [place], Loel Lofty, DO  Anesthesia: General  Staff:  Circulator: Tresa Res, RN Relief Circulator: Jasmine Dingwall, RN Scrub Person: Jasmine Libman, RN Circulator Assistant: Jasmine Ply, RN  Estimated Blood Loss: Minimal               Specimens: sent to path  Indications: This is [name] 51 year old female with [name] recent diagnosis of [name] right breast cancer.  There is extension toward the nipple areolar complex.  After [name] long discussion, she wished to proceed with [name] right mastectomy with deep axillary sentinel lymph node biopsy as well as [name] left prophylactic mastectomy  Procedure: The patient was brought to the operating room and identified as the correct patient.  She was placed upon the operating table and general anesthesia was induced.  I used the neoprobe and could not identify an area of increased uptake in the right axilla, therefore I prepped the right breast and injected blue dye underneath the areola.  The breast was then massaged.  Her bilateral breast, chest, and axilla were then prepped and draped in the usual sterile fashion.  I made an elliptical incision around the right nipple areolar complex with [name] scalpel.  I then took this down to the breast tissue with electrocautery.  With the aid of the skin retractors, I then created an anterior flap with electrocautery going up toward the clavicle and then down to the chest wall.  I then likewise created an inferior breast flap going down toward the inframammary ridge.  We then took the dissection medial to lateral toward the axilla.  Once the skin  flaps were created I then slowly dissected the breast off the chest wall with electrocautery dissecting medial to lateral.  The breast specimen was then completely removed.  I marked the lateral edge with [name] Vicryl suture.  With the aid of the neoprobe, I then dissected down into the deep right axilla.  I was able to identify 3 sentinel lymph nodes with the aid of the neoprobe which also had uptake of blue dye.  These were excised and sent to pathology for evaluation.  Hemostasis appeared to be achieved with cautery.  We then irrigated the skin flaps and chest with saline.  Placed [name] laparotomy pad in the mastectomy site.  I next turned my attention toward the left breast.  [name] similar elliptical incision was made around the nipple areolar complex.  I took the dissection down to the breast tissue with electrocautery.  I then created [name] superior skin flap with the aid of the skin retractors going toward the clavicle down the chest wall.  I then likewise created an inferior breast flap going down toward the inframammary ridge with cautery and skin retractors.  We then took the dissection around toward the axilla.  I next took the breast tissue off the chest wall with the electrocautery dissecting medial to lateral.  I then completed the total mastectomy on the left side.  Again the specimen was marked with [name] lateral suture and is sent to pathology for evaluation.  Again hemostasis appeared to be achieved on the left side.  We  irrigated the wound with saline. At this point, Dr. Marla Roe presented into the room to continue the procedure with the bilateral tissue expanders.  She will dictate this portion of the procedure.            Jasmine [name]   Date: 10/01/2017  Time: 12:13 PM

## 2017-10-01 NOTE — Anesthesia Postprocedure Evaluation (Signed)
Anesthesia Post Note  Patient: Jasmine [name]  Procedure(s) Performed: RIGHT  MASTECTOMY WITH RIGHT SENTINEL LYMPH NODE BIOPSY (Right Breast) Left Mastectomy (Left Breast) BILATERAL BREAST RECONSTRUCTION WITH PLACEMENT OF TISSUE EXPANDER AND FLEX HD (ACELLULAR HYDRATED DERMIS) (Bilateral Breast)     Patient location during evaluation: PACU Anesthesia Type: General Level of consciousness: awake and alert Pain management: pain level controlled Vital Signs Assessment: post-procedure vital signs reviewed and stable Respiratory status: spontaneous breathing, nonlabored ventilation, respiratory function stable and patient connected to nasal cannula oxygen Cardiovascular status: blood pressure returned to baseline and stable Postop Assessment: no apparent nausea or vomiting Anesthetic complications: no    Last Vitals:  Vitals:   10/01/17 1445 10/01/17 1500  BP: 135/71 137/69  Pulse: 91 90  Resp: 17 15  Temp:    SpO2: 100% 93%    Last Pain:  Vitals:   10/01/17 1500  TempSrc:   PainSc: 5                  Tiajuana Amass

## 2017-10-02 ENCOUNTER — Encounter (HOSPITAL_BASED_OUTPATIENT_CLINIC_OR_DEPARTMENT_OTHER): Payer: Self-pay | Admitting: Surgery

## 2017-10-02 DIAGNOSIS — C50911 Malignant neoplasm of unspecified site of right female breast: Secondary | ICD-10-CM | POA: Diagnosis not present

## 2017-10-02 LAB — POCT HEMOGLOBIN-HEMACUE: Hemoglobin: 13.3 g/dL (ref 12.0–15.0)

## 2017-10-02 NOTE — Addendum Note (Signed)
Addendum  created 10/02/17 1346 by Terrance Mass, CRNA   Charge Capture section accepted

## 2017-10-16 DIAGNOSIS — C50911 Malignant neoplasm of unspecified site of right female breast: Secondary | ICD-10-CM | POA: Diagnosis not present

## 2017-10-16 DIAGNOSIS — Z170 Estrogen receptor positive status [ER+]: Secondary | ICD-10-CM | POA: Diagnosis not present

## 2017-10-16 DIAGNOSIS — Z9011 Acquired absence of right breast and nipple: Secondary | ICD-10-CM | POA: Diagnosis not present

## 2017-10-23 DIAGNOSIS — Z79899 Other long term (current) drug therapy: Secondary | ICD-10-CM | POA: Diagnosis not present

## 2017-10-23 DIAGNOSIS — C50511 Malignant neoplasm of lower-outer quadrant of right female breast: Secondary | ICD-10-CM | POA: Diagnosis not present

## 2017-11-04 ENCOUNTER — Ambulatory Visit: Payer: Self-pay | Admitting: Plastic Surgery

## 2017-11-04 DIAGNOSIS — S21001A Unspecified open wound of right breast, initial encounter: Secondary | ICD-10-CM

## 2017-11-05 ENCOUNTER — Inpatient Hospital Stay (HOSPITAL_COMMUNITY): Payer: Commercial Managed Care - PPO

## 2017-11-05 ENCOUNTER — Ambulatory Visit (HOSPITAL_COMMUNITY): Payer: Commercial Managed Care - PPO

## 2017-11-05 ENCOUNTER — Encounter (HOSPITAL_COMMUNITY)
Admission: AD | Disposition: A | Discharge status: Home / Self Care | Payer: Self-pay | Source: Ambulatory Visit | Attending: Student in an Organized Health Care Education/Training Program

## 2017-11-05 ENCOUNTER — Ambulatory Visit (HOSPITAL_COMMUNITY): Payer: Commercial Managed Care - PPO | Admitting: Anesthesiology

## 2017-11-05 ENCOUNTER — Encounter (HOSPITAL_BASED_OUTPATIENT_CLINIC_OR_DEPARTMENT_OTHER): Payer: Self-pay | Admitting: *Deleted

## 2017-11-05 ENCOUNTER — Other Ambulatory Visit: Payer: Self-pay

## 2017-11-05 ENCOUNTER — Inpatient Hospital Stay (HOSPITAL_BASED_OUTPATIENT_CLINIC_OR_DEPARTMENT_OTHER)
Admission: AD | Admit: 2017-11-05 | Discharge: 2017-11-08 | DRG: 856 | Disposition: A | Discharge status: Home / Self Care | Payer: Commercial Managed Care - PPO | Source: Ambulatory Visit | Attending: Internal Medicine | Admitting: Internal Medicine

## 2017-11-05 DIAGNOSIS — R0902 Hypoxemia: Secondary | ICD-10-CM

## 2017-11-05 DIAGNOSIS — A4102 Sepsis due to Methicillin resistant Staphylococcus aureus: Secondary | ICD-10-CM | POA: Diagnosis not present

## 2017-11-05 DIAGNOSIS — R0602 Shortness of breath: Secondary | ICD-10-CM

## 2017-11-05 DIAGNOSIS — Z1629 Resistance to other single specified antibiotic: Secondary | ICD-10-CM | POA: Diagnosis not present

## 2017-11-05 DIAGNOSIS — J09X2 Influenza due to identified novel influenza A virus with other respiratory manifestations: Secondary | ICD-10-CM

## 2017-11-05 DIAGNOSIS — C50911 Malignant neoplasm of unspecified site of right female breast: Secondary | ICD-10-CM | POA: Diagnosis present

## 2017-11-05 DIAGNOSIS — D649 Anemia, unspecified: Secondary | ICD-10-CM

## 2017-11-05 DIAGNOSIS — Y838 Other surgical procedures as the cause of abnormal reaction of the patient, or of later complication, without mention of misadventure at the time of the procedure: Secondary | ICD-10-CM | POA: Diagnosis present

## 2017-11-05 DIAGNOSIS — J1000 Influenza due to other identified influenza virus with unspecified type of pneumonia: Secondary | ICD-10-CM | POA: Diagnosis present

## 2017-11-05 DIAGNOSIS — E861 Hypovolemia: Secondary | ICD-10-CM | POA: Diagnosis present

## 2017-11-05 DIAGNOSIS — E8809 Other disorders of plasma-protein metabolism, not elsewhere classified: Secondary | ICD-10-CM

## 2017-11-05 DIAGNOSIS — M545 Low back pain: Secondary | ICD-10-CM | POA: Diagnosis present

## 2017-11-05 DIAGNOSIS — T8579XA Infection and inflammatory reaction due to other internal prosthetic devices, implants and grafts, initial encounter: Secondary | ICD-10-CM | POA: Diagnosis not present

## 2017-11-05 DIAGNOSIS — T8131XA Disruption of external operation (surgical) wound, not elsewhere classified, initial encounter: Secondary | ICD-10-CM | POA: Diagnosis present

## 2017-11-05 DIAGNOSIS — Z170 Estrogen receptor positive status [ER+]: Secondary | ICD-10-CM | POA: Diagnosis not present

## 2017-11-05 DIAGNOSIS — E876 Hypokalemia: Secondary | ICD-10-CM | POA: Diagnosis present

## 2017-11-05 DIAGNOSIS — R059 Cough, unspecified: Secondary | ICD-10-CM

## 2017-11-05 DIAGNOSIS — F419 Anxiety disorder, unspecified: Secondary | ICD-10-CM | POA: Diagnosis present

## 2017-11-05 DIAGNOSIS — L0889 Other specified local infections of the skin and subcutaneous tissue: Secondary | ICD-10-CM

## 2017-11-05 DIAGNOSIS — T8144XA Sepsis following a procedure, initial encounter: Secondary | ICD-10-CM | POA: Diagnosis not present

## 2017-11-05 DIAGNOSIS — R05 Cough: Secondary | ICD-10-CM

## 2017-11-05 DIAGNOSIS — D709 Neutropenia, unspecified: Secondary | ICD-10-CM | POA: Diagnosis present

## 2017-11-05 DIAGNOSIS — F17210 Nicotine dependence, cigarettes, uncomplicated: Secondary | ICD-10-CM | POA: Diagnosis present

## 2017-11-05 DIAGNOSIS — E871 Hypo-osmolality and hyponatremia: Secondary | ICD-10-CM | POA: Diagnosis present

## 2017-11-05 DIAGNOSIS — Z720 Tobacco use: Secondary | ICD-10-CM

## 2017-11-05 DIAGNOSIS — D61818 Other pancytopenia: Secondary | ICD-10-CM | POA: Diagnosis present

## 2017-11-05 DIAGNOSIS — R5081 Fever presenting with conditions classified elsewhere: Secondary | ICD-10-CM | POA: Diagnosis present

## 2017-11-05 DIAGNOSIS — Z9013 Acquired absence of bilateral breasts and nipples: Secondary | ICD-10-CM | POA: Diagnosis not present

## 2017-11-05 DIAGNOSIS — Z9689 Presence of other specified functional implants: Secondary | ICD-10-CM

## 2017-11-05 DIAGNOSIS — T8149XA Infection following a procedure, other surgical site, initial encounter: Secondary | ICD-10-CM | POA: Diagnosis present

## 2017-11-05 DIAGNOSIS — L089 Local infection of the skin and subcutaneous tissue, unspecified: Secondary | ICD-10-CM | POA: Diagnosis present

## 2017-11-05 DIAGNOSIS — A419 Sepsis, unspecified organism: Secondary | ICD-10-CM | POA: Diagnosis present

## 2017-11-05 DIAGNOSIS — Z853 Personal history of malignant neoplasm of breast: Secondary | ICD-10-CM | POA: Diagnosis not present

## 2017-11-05 DIAGNOSIS — T8142XA Infection following a procedure, deep incisional surgical site, initial encounter: Secondary | ICD-10-CM | POA: Diagnosis present

## 2017-11-05 DIAGNOSIS — D72819 Decreased white blood cell count, unspecified: Secondary | ICD-10-CM

## 2017-11-05 DIAGNOSIS — S21001A Unspecified open wound of right breast, initial encounter: Secondary | ICD-10-CM

## 2017-11-05 DIAGNOSIS — R197 Diarrhea, unspecified: Secondary | ICD-10-CM | POA: Diagnosis present

## 2017-11-05 DIAGNOSIS — D696 Thrombocytopenia, unspecified: Secondary | ICD-10-CM

## 2017-11-05 DIAGNOSIS — Z79899 Other long term (current) drug therapy: Secondary | ICD-10-CM | POA: Diagnosis not present

## 2017-11-05 HISTORY — PX: DEBRIDEMENT AND CLOSURE WOUND: SHX5614

## 2017-11-05 LAB — I-STAT ARTERIAL BLOOD GAS, ED
ACID-BASE DEFICIT: 1 mmol/L (ref 0.0–2.0)
BICARBONATE: 23.9 mmol/L (ref 20.0–28.0)
O2 SAT: 97 %
TCO2: 25 mmol/L (ref 22–32)
pCO2 arterial: 37.9 mmHg (ref 32.0–48.0)
pH, Arterial: 7.409 (ref 7.350–7.450)
pO2, Arterial: 90 mmHg (ref 83.0–108.0)

## 2017-11-05 LAB — BRAIN NATRIURETIC PEPTIDE: B NATRIURETIC PEPTIDE 5: 53.2 pg/mL (ref 0.0–100.0)

## 2017-11-05 LAB — URINALYSIS, ROUTINE W REFLEX MICROSCOPIC
BACTERIA UA: NONE SEEN
Bilirubin Urine: NEGATIVE
Glucose, UA: NEGATIVE mg/dL
Ketones, ur: 5 mg/dL — AB
Leukocytes, UA: NEGATIVE
Nitrite: NEGATIVE
PROTEIN: 100 mg/dL — AB
SPECIFIC GRAVITY, URINE: 1.023 (ref 1.005–1.030)
pH: 6 (ref 5.0–8.0)

## 2017-11-05 LAB — COMPREHENSIVE METABOLIC PANEL
ALBUMIN: 2.7 g/dL — AB (ref 3.5–5.0)
ALK PHOS: 86 U/L (ref 38–126)
ALT: 17 U/L (ref 14–54)
AST: 23 U/L (ref 15–41)
Anion gap: 8 (ref 5–15)
BUN: 14 mg/dL (ref 6–20)
CALCIUM: 7.8 mg/dL — AB (ref 8.9–10.3)
CHLORIDE: 96 mmol/L — AB (ref 101–111)
CO2: 23 mmol/L (ref 22–32)
CREATININE: 0.69 mg/dL (ref 0.44–1.00)
GFR calc non Af Amer: >60 mL/min (ref >60)
GLUCOSE: 103 mg/dL — AB (ref 65–99)
Potassium: 3.7 mmol/L (ref 3.5–5.1)
SODIUM: 127 mmol/L — AB (ref 135–145)
Total Bilirubin: 0.5 mg/dL (ref 0.3–1.2)
Total Protein: 5.5 g/dL — ABNORMAL LOW (ref 6.5–8.1)

## 2017-11-05 LAB — CBC WITH DIFFERENTIAL/PLATELET
BAND NEUTROPHILS: 13 %
BASOS ABS: 0 10*3/uL (ref 0.0–0.1)
BASOS PCT: 0 %
BLASTS: 0 %
EOS ABS: 0.2 10*3/uL (ref 0.0–0.7)
Eosinophils Relative: 7 %
HEMATOCRIT: 32.3 % — AB (ref 36.0–46.0)
Hemoglobin: 11.1 g/dL — ABNORMAL LOW (ref 12.0–15.0)
Lymphocytes Relative: 22 %
Lymphs Abs: 0.7 10*3/uL (ref 0.7–4.0)
MCH: 32.7 pg (ref 26.0–34.0)
MCHC: 34.4 g/dL (ref 30.0–36.0)
MCV: 95.3 fL (ref 78.0–100.0)
METAMYELOCYTES PCT: 1 %
MONO ABS: 0.2 10*3/uL (ref 0.1–1.0)
MONOS PCT: 5 %
Myelocytes: 0 %
NEUTROS ABS: 2 10*3/uL (ref 1.7–7.7)
Neutrophils Relative %: 52 %
Other: 0 %
PROMYELOCYTES ABS: 0 %
Platelets: 121 10*3/uL — ABNORMAL LOW (ref 150–400)
RBC: 3.39 MIL/uL — ABNORMAL LOW (ref 3.87–5.11)
RDW: 13.3 % (ref 11.5–15.5)
WBC: 3.1 10*3/uL — ABNORMAL LOW (ref 4.0–10.5)
nRBC: 0 /100 WBC

## 2017-11-05 LAB — I-STAT CG4 LACTIC ACID, ED: Lactic Acid, Venous: 1.08 mmol/L (ref 0.5–1.9)

## 2017-11-05 LAB — I-STAT TROPONIN, ED: TROPONIN I, POC: 0 ng/mL (ref 0.00–0.08)

## 2017-11-05 LAB — INFLUENZA PANEL BY PCR (TYPE A & B)
Influenza A By PCR: POSITIVE — AB
Influenza B By PCR: NEGATIVE

## 2017-11-05 SURGERY — DEBRIDEMENT, WOUND, WITH CLOSURE
Anesthesia: General | Laterality: Right

## 2017-11-05 SURGERY — DEBRIDEMENT, WOUND, WITH CLOSURE
Anesthesia: General | Site: Breast | Laterality: Right

## 2017-11-05 MED ORDER — FENTANYL CITRATE (PF) 100 MCG/2ML IJ SOLN: 50.0000 ug | INTRAMUSCULAR | Status: DC | PRN

## 2017-11-05 MED ORDER — ACETAMINOPHEN 325 MG PO TABS: 650.0000 mg | ORAL_TABLET | Freq: Four times a day (QID) | ORAL | Status: DC | PRN

## 2017-11-05 MED ORDER — GUAIFENESIN-DM 100-10 MG/5ML PO SYRP
5.0000 mL | ORAL_SOLUTION | ORAL | Status: DC | PRN
  Administered 2017-11-06 – 2017-11-08 (×5): 5 mL via ORAL
  Filled 2017-11-05 (×5): qty 5

## 2017-11-05 MED ORDER — SCOPOLAMINE 1 MG/3DAYS TD PT72: 1.0000 | MEDICATED_PATCH | Freq: Once | TRANSDERMAL | Status: DC | PRN

## 2017-11-05 MED ORDER — FENTANYL CITRATE (PF) 100 MCG/2ML IJ SOLN: 25.0000 ug | INTRAMUSCULAR | Status: DC | PRN

## 2017-11-05 MED ORDER — 0.9 % SODIUM CHLORIDE (POUR BTL) OPTIME
TOPICAL | Status: DC | PRN
  Administered 2017-11-05: 1000 mL

## 2017-11-05 MED ORDER — ENOXAPARIN SODIUM 40 MG/0.4ML ~~LOC~~ SOLN
40.0000 mg | SUBCUTANEOUS | Status: DC
  Administered 2017-11-06 – 2017-11-08 (×3): 40 mg via SUBCUTANEOUS
  Filled 2017-11-05 (×3): qty 0.4

## 2017-11-05 MED ORDER — SODIUM CHLORIDE 0.9 % IV SOLN
1000.0000 mL | INTRAVENOUS | Status: DC
  Administered 2017-11-05 (×2): 1000 mL via INTRAVENOUS

## 2017-11-05 MED ORDER — DEXAMETHASONE SODIUM PHOSPHATE 10 MG/ML IJ SOLN
INTRAMUSCULAR | Status: AC
  Filled 2017-11-05: qty 1

## 2017-11-05 MED ORDER — OSELTAMIVIR PHOSPHATE 75 MG PO CAPS
75.0000 mg | ORAL_CAPSULE | Freq: Two times a day (BID) | ORAL | Status: DC
  Administered 2017-11-05 – 2017-11-08 (×6): 75 mg via ORAL
  Filled 2017-11-05 (×8): qty 1

## 2017-11-05 MED ORDER — IPRATROPIUM BROMIDE 0.02 % IN SOLN
0.5000 mg | Freq: Once | RESPIRATORY_TRACT | Status: AC
  Administered 2017-11-05: 0.5 mg via RESPIRATORY_TRACT
  Filled 2017-11-05: qty 2.5

## 2017-11-05 MED ORDER — DEXAMETHASONE SODIUM PHOSPHATE 10 MG/ML IJ SOLN
INTRAMUSCULAR | Status: DC | PRN
  Administered 2017-11-05: 10 mg via INTRAVENOUS

## 2017-11-05 MED ORDER — FENTANYL CITRATE (PF) 100 MCG/2ML IJ SOLN
50.0000 ug | Freq: Once | INTRAMUSCULAR | Status: AC
  Administered 2017-11-05: 50 ug via INTRAVENOUS
  Filled 2017-11-05: qty 2

## 2017-11-05 MED ORDER — ALBUMIN HUMAN 5 % IV SOLN
INTRAVENOUS | Status: DC | PRN
  Administered 2017-11-05: 18:00:00 via INTRAVENOUS

## 2017-11-05 MED ORDER — DEXTROSE 5 % IV SOLN
1.0000 g | Freq: Three times a day (TID) | INTRAVENOUS | Status: DC
  Administered 2017-11-05 – 2017-11-06 (×2): 1 g via INTRAVENOUS
  Filled 2017-11-05 (×3): qty 1

## 2017-11-05 MED ORDER — LACTATED RINGERS IV SOLN
INTRAVENOUS | Status: DC
  Administered 2017-11-05 – 2017-11-08 (×5): via INTRAVENOUS

## 2017-11-05 MED ORDER — OXYCODONE HCL 5 MG/5ML PO SOLN: 5.0000 mg | Freq: Once | ORAL | Status: DC | PRN

## 2017-11-05 MED ORDER — DEXTROSE 5 % IV SOLN
2.0000 g | Freq: Once | INTRAVENOUS | Status: DC
  Administered 2017-11-05: 2 g via INTRAVENOUS

## 2017-11-05 MED ORDER — MIDAZOLAM HCL 2 MG/2ML IJ SOLN
INTRAMUSCULAR | Status: AC
  Filled 2017-11-05: qty 2

## 2017-11-05 MED ORDER — ONDANSETRON HCL 4 MG/2ML IJ SOLN
INTRAMUSCULAR | Status: DC | PRN
  Administered 2017-11-05: 4 mg via INTRAVENOUS

## 2017-11-05 MED ORDER — MIDAZOLAM HCL 2 MG/2ML IJ SOLN: 1.0000 mg | INTRAMUSCULAR | Status: DC | PRN

## 2017-11-05 MED ORDER — ONDANSETRON HCL 4 MG/2ML IJ SOLN
INTRAMUSCULAR | Status: AC
  Filled 2017-11-05: qty 2

## 2017-11-05 MED ORDER — VANCOMYCIN HCL 10 G IV SOLR
1250.0000 mg | Freq: Once | INTRAVENOUS | Status: AC
  Administered 2017-11-05: 1250 mg via INTRAVENOUS
  Filled 2017-11-05: qty 1250

## 2017-11-05 MED ORDER — FENTANYL CITRATE (PF) 100 MCG/2ML IJ SOLN
INTRAMUSCULAR | Status: DC | PRN
  Administered 2017-11-05: 50 ug via INTRAVENOUS
  Administered 2017-11-05: 100 ug via INTRAVENOUS
  Administered 2017-11-05 (×2): 50 ug via INTRAVENOUS

## 2017-11-05 MED ORDER — DEXAMETHASONE SODIUM PHOSPHATE 10 MG/ML IJ SOLN
INTRAMUSCULAR | Status: AC
  Filled 2017-11-05: qty 2

## 2017-11-05 MED ORDER — ALPRAZOLAM 0.5 MG PO TABS
0.5000 mg | ORAL_TABLET | Freq: Every day | ORAL | Status: DC
  Administered 2017-11-05 – 2017-11-07 (×3): 0.5 mg via ORAL
  Filled 2017-11-05 (×3): qty 1

## 2017-11-05 MED ORDER — CEFAZOLIN SODIUM-DEXTROSE 2-4 GM/100ML-% IV SOLN
2.0000 g | INTRAVENOUS | Status: DC
Start: 2017-11-05 — End: 2017-11-05

## 2017-11-05 MED ORDER — LIDOCAINE-EPINEPHRINE 1 %-1:100000 IJ SOLN
INTRAMUSCULAR | Status: AC
  Filled 2017-11-05: qty 1

## 2017-11-05 MED ORDER — LIDOCAINE HCL (CARDIAC) 20 MG/ML IV SOLN
INTRAVENOUS | Status: DC | PRN
  Administered 2017-11-05: 40 mg via INTRAVENOUS

## 2017-11-05 MED ORDER — PHENYLEPHRINE HCL 10 MG/ML IJ SOLN
INTRAMUSCULAR | Status: DC | PRN
  Administered 2017-11-05 (×4): 80 ug via INTRAVENOUS

## 2017-11-05 MED ORDER — FENTANYL CITRATE (PF) 100 MCG/2ML IJ SOLN
INTRAMUSCULAR | Status: AC
  Filled 2017-11-05: qty 2

## 2017-11-05 MED ORDER — LACTATED RINGERS IV SOLN
INTRAVENOUS | Status: DC
  Administered 2017-11-05 (×3): via INTRAVENOUS

## 2017-11-05 MED ORDER — ALBUTEROL SULFATE (2.5 MG/3ML) 0.083% IN NEBU
5.0000 mg | INHALATION_SOLUTION | Freq: Once | RESPIRATORY_TRACT | Status: AC
  Administered 2017-11-05: 5 mg via RESPIRATORY_TRACT
  Filled 2017-11-05: qty 6

## 2017-11-05 MED ORDER — ONDANSETRON HCL 4 MG/2ML IJ SOLN: 4.0000 mg | Freq: Once | INTRAMUSCULAR | Status: DC | PRN

## 2017-11-05 MED ORDER — PROPOFOL 10 MG/ML IV BOLUS
INTRAVENOUS | Status: DC | PRN
  Administered 2017-11-05: 160 mg via INTRAVENOUS

## 2017-11-05 MED ORDER — SODIUM CHLORIDE 0.9 % IV SOLN
INTRAVENOUS | Status: DC | PRN
  Administered 2017-11-05: 500 mL

## 2017-11-05 MED ORDER — SODIUM CHLORIDE 0.9 % IV SOLN: INTRAVENOUS | Status: DC

## 2017-11-05 MED ORDER — SENNOSIDES-DOCUSATE SODIUM 8.6-50 MG PO TABS: 1.0000 | ORAL_TABLET | Freq: Every evening | ORAL | Status: DC | PRN

## 2017-11-05 MED ORDER — OXYCODONE HCL 5 MG PO TABS: 5.0000 mg | ORAL_TABLET | Freq: Once | ORAL | Status: DC | PRN

## 2017-11-05 MED ORDER — MUPIROCIN CALCIUM 2 % EX CREA
TOPICAL_CREAM | Freq: Two times a day (BID) | CUTANEOUS | Status: DC
  Administered 2017-11-06 – 2017-11-07 (×4): via TOPICAL
  Administered 2017-11-07: 1 via TOPICAL
  Administered 2017-11-08: 11:00:00 via TOPICAL
  Filled 2017-11-05: qty 15

## 2017-11-05 MED ORDER — OXYCODONE-ACETAMINOPHEN 5-325 MG PO TABS
1.0000 | ORAL_TABLET | ORAL | Status: DC | PRN
  Administered 2017-11-06 (×2): 2 via ORAL
  Administered 2017-11-06: 1 via ORAL
  Administered 2017-11-07: 2 via ORAL
  Administered 2017-11-07 (×2): 1 via ORAL
  Administered 2017-11-07: 2 via ORAL
  Administered 2017-11-08 (×2): 1 via ORAL
  Filled 2017-11-05: qty 1
  Filled 2017-11-05 (×2): qty 2
  Filled 2017-11-05 (×2): qty 1
  Filled 2017-11-05: qty 2
  Filled 2017-11-05: qty 1
  Filled 2017-11-05 (×2): qty 2

## 2017-11-05 MED ORDER — LACTATED RINGERS IV SOLN
INTRAVENOUS | Status: DC
  Administered 2017-11-05: 17:00:00 via INTRAVENOUS

## 2017-11-05 MED ORDER — ACETAMINOPHEN 650 MG RE SUPP: 650.0000 mg | Freq: Four times a day (QID) | RECTAL | Status: DC | PRN

## 2017-11-05 MED ORDER — ALBUTEROL SULFATE (2.5 MG/3ML) 0.083% IN NEBU
2.5000 mg | INHALATION_SOLUTION | RESPIRATORY_TRACT | Status: DC | PRN
  Administered 2017-11-06 – 2017-11-08 (×9): 2.5 mg via RESPIRATORY_TRACT
  Filled 2017-11-05 (×10): qty 3

## 2017-11-05 MED ORDER — CEFAZOLIN SODIUM-DEXTROSE 2-4 GM/100ML-% IV SOLN
INTRAVENOUS | Status: AC
  Filled 2017-11-05: qty 100

## 2017-11-05 MED ORDER — DEXTROSE 5 % IV SOLN
2.0000 g | Freq: Once | INTRAVENOUS | Status: AC
  Administered 2017-11-05: 2 g via INTRAVENOUS
  Filled 2017-11-05: qty 2

## 2017-11-05 MED ORDER — VANCOMYCIN HCL IN DEXTROSE 750-5 MG/150ML-% IV SOLN
750.0000 mg | Freq: Two times a day (BID) | INTRAVENOUS | Status: DC
  Administered 2017-11-06 – 2017-11-08 (×6): 750 mg via INTRAVENOUS
  Filled 2017-11-05 (×6): qty 150

## 2017-11-05 MED ORDER — MUPIROCIN 2 % EX OINT
TOPICAL_OINTMENT | CUTANEOUS | Status: AC
  Filled 2017-11-05: qty 22

## 2017-11-05 MED ORDER — SUCCINYLCHOLINE CHLORIDE 20 MG/ML IJ SOLN
INTRAMUSCULAR | Status: DC | PRN
  Administered 2017-11-05: 120 mg via INTRAVENOUS

## 2017-11-05 MED ORDER — PROPOFOL 10 MG/ML IV BOLUS
INTRAVENOUS | Status: AC
  Filled 2017-11-05: qty 20

## 2017-11-05 MED ORDER — VANCOMYCIN HCL IN DEXTROSE 1-5 GM/200ML-% IV SOLN: 1000.0000 mg | Freq: Once | INTRAVENOUS | Status: DC

## 2017-11-05 MED ORDER — FENTANYL CITRATE (PF) 250 MCG/5ML IJ SOLN
INTRAMUSCULAR | Status: AC
  Filled 2017-11-05: qty 5

## 2017-11-05 SURGICAL SUPPLY — 51 items
APPLICATOR COTTON TIP 6IN STRL (MISCELLANEOUS) IMPLANT
BAG DECANTER FOR FLEXI CONT (MISCELLANEOUS) IMPLANT
BENZOIN TINCTURE PRP APPL 2/3 (GAUZE/BANDAGES/DRESSINGS) ×3 IMPLANT
BINDER BREAST LRG (GAUZE/BANDAGES/DRESSINGS) ×3 IMPLANT
BIOPATCH RED 1 DISK 7.0 (GAUZE/BANDAGES/DRESSINGS) ×2 IMPLANT
BIOPATCH RED 1IN DISK 7.0MM (GAUZE/BANDAGES/DRESSINGS) ×1
CANISTER SUCT 3000ML PPV (MISCELLANEOUS) ×3 IMPLANT
CONT SPEC 4OZ CLIKSEAL STRL BL (MISCELLANEOUS) IMPLANT
COVER SURGICAL LIGHT HANDLE (MISCELLANEOUS) ×3 IMPLANT
DRAIN CHANNEL 19F RND (DRAIN) ×3 IMPLANT
DRAPE HALF SHEET 40X57 (DRAPES) IMPLANT
DRAPE IMP U-DRAPE 54X76 (DRAPES) ×3 IMPLANT
DRAPE INCISE IOBAN 66X45 STRL (DRAPES) IMPLANT
DRAPE LAPAROSCOPIC ABDOMINAL (DRAPES) IMPLANT
DRAPE LAPAROTOMY 100X72 PEDS (DRAPES) ×3 IMPLANT
DRESSING HYDROCOLLOID 4X4 (GAUZE/BANDAGES/DRESSINGS) ×3 IMPLANT
DRSG ADAPTIC 3X8 NADH LF (GAUZE/BANDAGES/DRESSINGS) IMPLANT
DRSG PAD ABDOMINAL 8X10 ST (GAUZE/BANDAGES/DRESSINGS) ×3 IMPLANT
DRSG VAC ATS LRG SENSATRAC (GAUZE/BANDAGES/DRESSINGS) IMPLANT
DRSG VAC ATS MED SENSATRAC (GAUZE/BANDAGES/DRESSINGS) IMPLANT
DRSG VAC ATS SM SENSATRAC (GAUZE/BANDAGES/DRESSINGS) IMPLANT
ELECT CAUTERY BLADE 6.4 (BLADE) IMPLANT
ELECT REM PT RETURN 9FT ADLT (ELECTROSURGICAL) ×3
ELECTRODE REM PT RTRN 9FT ADLT (ELECTROSURGICAL) ×1 IMPLANT
EVACUATOR SILICONE 100CC (DRAIN) ×3 IMPLANT
GAUZE SPONGE 4X4 12PLY STRL (GAUZE/BANDAGES/DRESSINGS) ×3 IMPLANT
GLOVE BIO SURGEON STRL SZ 6.5 (GLOVE) ×2 IMPLANT
GLOVE BIO SURGEONS STRL SZ 6.5 (GLOVE) ×1
GOWN STRL REUS W/ TWL LRG LVL3 (GOWN DISPOSABLE) ×3 IMPLANT
GOWN STRL REUS W/TWL LRG LVL3 (GOWN DISPOSABLE) ×6
KIT BASIN OR (CUSTOM PROCEDURE TRAY) ×3 IMPLANT
KIT ROOM TURNOVER OR (KITS) ×3 IMPLANT
NEEDLE FILTER BLUNT 18X 1/2SAF (NEEDLE) ×2
NEEDLE FILTER BLUNT 18X1 1/2 (NEEDLE) ×1 IMPLANT
NEEDLE HYPO 25GX1X1/2 BEV (NEEDLE) ×3 IMPLANT
NS IRRIG 1000ML POUR BTL (IV SOLUTION) ×3 IMPLANT
PACK GENERAL/GYN (CUSTOM PROCEDURE TRAY) ×3 IMPLANT
PACK UNIVERSAL I (CUSTOM PROCEDURE TRAY) ×3 IMPLANT
PAD ARMBOARD 7.5X6 YLW CONV (MISCELLANEOUS) ×6 IMPLANT
STAPLER VISISTAT 35W (STAPLE) ×3 IMPLANT
SURGILUBE 2OZ TUBE FLIPTOP (MISCELLANEOUS) IMPLANT
SUT MNCRL AB 3-0 PS2 18 (SUTURE) ×6 IMPLANT
SUT MNCRL AB 4-0 PS2 18 (SUTURE) ×3 IMPLANT
SUT MON AB 5-0 PS2 18 (SUTURE) ×3 IMPLANT
SUT SILK 4 0 PS 2 (SUTURE) ×3 IMPLANT
SUT VIC AB 5-0 PS2 18 (SUTURE) IMPLANT
SWAB COLLECTION DEVICE MRSA (MISCELLANEOUS) IMPLANT
SWAB CULTURE ESWAB REG 1ML (MISCELLANEOUS) IMPLANT
SYR CONTROL 10ML LL (SYRINGE) ×3 IMPLANT
TOWEL OR 17X26 10 PK STRL BLUE (TOWEL DISPOSABLE) ×3 IMPLANT
UNDERPAD 30X30 (UNDERPADS AND DIAPERS) ×3 IMPLANT

## 2017-11-05 NOTE — ED Provider Notes (Signed)
  Face-to-face evaluation   History: She complains of being ill for several days with general achiness, coughing, rhinorrhea, and weakness.  She was due to have breast revision surgery this morning but sent here after evaluated by anesthesia.  Patient had recent placement of bilateral breast tissue expanders, following mastectomy.  Physical exam: Alert, calm, lying left lateral decubitus position, not in respiratory distress.  Lungs with rhonchi and rales right greater than left.  No wheezing.  Extremities without edema.  Breast evaluation: Right breast with open draining wound, drainage is mucopurulent.  Mild erythema around the dehisced wound edges.  Right breast with fluctuance laterally.  Unable to visualize tissue expander within the breast.  Left breast with healing wound without dehiscence, redness, or drainage.  Medical screening examination/treatment/procedure(s) were conducted as a shared visit with non-physician practitioner(s) and myself.  I personally evaluated the patient during the encounter    Daleen Bo, MD 11/06/17 [phone]

## 2017-11-05 NOTE — ED Notes (Addendum)
Pt arrives EMS from Day Surgery . Pt was to have breast revision post bilateral mastectomy but arrived for Surg with cough non productive since Friday. Here for eval of potential pneumonia.Pt has had 1 L LR at center per EMS and 1 lL NS curently hanging.  Surgery will be rescheduled.

## 2017-11-05 NOTE — H&P (Signed)
Jasmine [name] is an 51 y.o. female.   Chief Complaint: right breast dehiscence  HPI: The patient is a 51 yrs old wf here for removal of a right breast expander.  She had an opening of the incision site and was seen in the office on Monday.  I wanted to send her to the ED but she declined.  She got progressively worse by this morning and was found to be very ill.  Surgery was postponed at the surgery center and she was sent to the ED for evaluation as pneumonia was suspected.  That has been negative per the ED to this time.  She has been admitted and is now in the sepsis protocol.  Removal of the expander is very important at this point.  This was discussed with the patient and she agrees.  Past Medical History:  Diagnosis Date  . Anxiety     Past Surgical History:  Procedure Laterality Date  . ABDOMINAL HYSTERECTOMY  2008  . BREAST RECONSTRUCTION WITH PLACEMENT OF TISSUE EXPANDER AND FLEX HD (ACELLULAR HYDRATED DERMIS) Bilateral 10/01/2017   Procedure: BILATERAL BREAST RECONSTRUCTION WITH PLACEMENT OF TISSUE EXPANDER AND FLEX HD (ACELLULAR HYDRATED DERMIS);  Surgeon: Jasmine Going, DO;  Location: [facility];  Service: Plastics;  Laterality: Bilateral;  . CHOLECYSTECTOMY  2008  . MASTECTOMY W/ SENTINEL NODE BIOPSY Right 10/01/2017   Procedure: RIGHT  MASTECTOMY WITH RIGHT SENTINEL LYMPH NODE BIOPSY;  Surgeon: Jasmine Keens, MD;  Location: [facility];  Service: General;  Laterality: Right;  . SIMPLE MASTECTOMY WITH AXILLARY SENTINEL NODE BIOPSY Left 10/01/2017   Procedure: Left Mastectomy;  Surgeon: Jasmine Keens, MD;  Location: [facility];  Service: General;  Laterality: Left;  . WISDOM TOOTH EXTRACTION  1984    Family History  Problem Relation Age of Onset  . Breast cancer Paternal Grandmother    Social History:  reports that she has been smoking.  She has been smoking about 1.00 pack per day. she has never used smokeless  tobacco. She reports that she does not drink alcohol or use drugs.  Allergies:  Allergies  Allergen Reactions  . Bee Venom Anaphylaxis    Swells internally     (Not in a hospital admission)  Results for orders placed or performed during the hospital encounter of 11/05/17 (from the past 48 hour(s))  I-Stat arterial blood gas, ED     Status: None   Collection Time: 11/05/17 11:22 AM  Result Value Ref Range   pH, Arterial 7.409 7.350 - 7.450   pCO2 arterial 37.9 32.0 - 48.0 mmHg   pO2, Arterial 90.0 83.0 - 108.0 mmHg   Bicarbonate 23.9 20.0 - 28.0 mmol/L   TCO2 25 22 - 32 mmol/L   O2 Saturation 97.0 %   Acid-base deficit 1.0 0.0 - 2.0 mmol/L   Patient temperature HIDE    Collection site RADIAL, ALLEN'S TEST ACCEPTABLE    Drawn by Operator    Sample type ARTERIAL   CBC with Differential     Status: Abnormal   Collection Time: 11/05/17 11:38 AM  Result Value Ref Range   WBC 3.1 (L) 4.0 - 10.5 K/uL    Comment: WHITE COUNT CONFIRMED ON SMEAR   RBC 3.39 (L) 3.87 - 5.11 MIL/uL   Hemoglobin 11.1 (L) 12.0 - 15.0 g/dL   HCT 32.3 (L) 36.0 - 46.0 %   MCV 95.3 78.0 - 100.0 fL   MCH 32.7 26.0 - 34.0 pg   MCHC 34.4  30.0 - 36.0 g/dL   RDW 13.3 11.5 - 15.5 %   Platelets 121 (L) 150 - 400 K/uL   Neutrophils Relative % 52 %   Lymphocytes Relative 22 %   Monocytes Relative 5 %   Eosinophils Relative 7 %   Basophils Relative 0 %   Band Neutrophils 13 %   Metamyelocytes Relative 1 %   Myelocytes 0 %   Promyelocytes Absolute 0 %   Blasts 0 %   nRBC 0 0 /100 WBC   Other 0 %   Neutro Abs 2.0 1.7 - 7.7 K/uL   Lymphs Abs 0.7 0.7 - 4.0 K/uL   Monocytes Absolute 0.2 0.1 - 1.0 K/uL   Eosinophils Absolute 0.2 0.0 - 0.7 K/uL   Basophils Absolute 0.0 0.0 - 0.1 K/uL   WBC Morphology MILD LEFT SHIFT (1-5% METAS, OCC MYELO, OCC BANDS)     Comment: ATYPICAL LYMPHOCYTES DOHLE BODIES   Brain natriuretic peptide     Status: None   Collection Time: 11/05/17 11:38 AM  Result Value Ref Range   B  Natriuretic Peptide 53.2 0.0 - 100.0 pg/mL  Comprehensive metabolic panel     Status: Abnormal   Collection Time: 11/05/17 11:38 AM  Result Value Ref Range   Sodium 127 (L) 135 - 145 mmol/L   Potassium 3.7 3.5 - 5.1 mmol/L   Chloride 96 (L) 101 - 111 mmol/L   CO2 23 22 - 32 mmol/L   Glucose, Bld 103 (H) 65 - 99 mg/dL   BUN 14 6 - 20 mg/dL   Creatinine, Ser 0.69 0.44 - 1.00 mg/dL   Calcium 7.8 (L) 8.9 - 10.3 mg/dL   Total Protein 5.5 (L) 6.5 - 8.1 g/dL   Albumin 2.7 (L) 3.5 - 5.0 g/dL   AST 23 15 - 41 U/L   ALT 17 14 - 54 U/L   Alkaline Phosphatase 86 38 - 126 U/L   Total Bilirubin 0.5 0.3 - 1.2 mg/dL   GFR calc non Af Amer >60 >60 mL/min   GFR calc Af Amer >60 >60 mL/min    Comment: (NOTE) The eGFR has been calculated using the CKD EPI equation. This calculation has not been validated in all clinical situations. eGFR's persistently <60 mL/min signify possible Chronic Kidney Disease.    Anion gap 8 5 - 15  I-stat troponin, ED     Status: None   Collection Time: 11/05/17 11:54 AM  Result Value Ref Range   Troponin i, poc 0.00 0.00 - 0.08 ng/mL   Comment 3            Comment: Due to the release kinetics of cTnI, a negative result within the first hours of the onset of symptoms does not rule out myocardial infarction with certainty. If myocardial infarction is still suspected, repeat the test at appropriate intervals.   Urinalysis, Routine w reflex microscopic     Status: Abnormal   Collection Time: 11/05/17  1:10 PM  Result Value Ref Range   Color, Urine YELLOW YELLOW   APPearance HAZY (A) CLEAR   Specific Gravity, Urine 1.023 1.005 - 1.030   pH 6.0 5.0 - 8.0   Glucose, UA NEGATIVE NEGATIVE mg/dL   Hgb urine dipstick SMALL (A) NEGATIVE   Bilirubin Urine NEGATIVE NEGATIVE   Ketones, ur 5 (A) NEGATIVE mg/dL   Protein, ur 100 (A) NEGATIVE mg/dL   Nitrite NEGATIVE NEGATIVE   Leukocytes, UA NEGATIVE NEGATIVE   RBC / HPF 0-5 0 - 5 RBC/hpf  WBC, UA 0-5 0 - 5 WBC/hpf    Bacteria, UA NONE SEEN NONE SEEN   Squamous Epithelial / LPF 0-5 (A) NONE SEEN   Mucus PRESENT   I-Stat CG4 Lactic Acid, ED  (not at  [facility])     Status: None   Collection Time: 11/05/17  1:25 PM  Result Value Ref Range   Lactic Acid, Venous 1.08 0.5 - 1.9 mmol/L   Dg Chest Portable 1 View  Result Date: 11/05/2017 CLINICAL DATA:  Short of breath EXAM: PORTABLE CHEST 1 VIEW COMPARISON:  10/29/2017 FINDINGS: Cardiac and mediastinal contours normal. Lungs are clear bilaterally. Port-A-Cath tip in the SVC.  Bilateral breast tissue expanders. IMPRESSION: No active disease. Electronically Signed   By: Franchot Gallo M.D.   On: 11/05/2017 12:22    Review of Systems  Constitutional: Positive for chills, fever and malaise/fatigue.  HENT: Negative.   Eyes: Negative.   Respiratory: Positive for cough and shortness of breath.   Cardiovascular: Positive for chest pain.  Gastrointestinal: Negative.   Genitourinary: Negative.   Musculoskeletal: Negative.   Skin: Negative.   Neurological: Positive for weakness. Negative for dizziness and seizures.  Psychiatric/Behavioral: Negative.     Blood pressure 101/61, pulse (!) 106, temperature (!) 100.7 F (38.2 C), temperature source Rectal, resp. rate (!) 33, height _0  (1.575 m), weight 63.5 kg (140 lb), SpO2 98 %. Physical Exam  Constitutional: She is oriented to person, place, and time. She appears well-developed and well-nourished.  HENT:  Head: Normocephalic.  Eyes: EOM are normal. Pupils are equal, round, and reactive to light.  Cardiovascular: Normal rate.  Respiratory: She has rales. She exhibits tenderness.    GI: Soft. She exhibits no distension. There is no tenderness.  Neurological: She is alert and oriented to person, place, and time.  Skin: Skin is warm. There is erythema.  Psychiatric: She has a normal mood and affect. Her behavior is normal.     Assessment/Plan Removal of right breast expander.  Olivia,  DO 11/05/2017, 4:51 PM

## 2017-11-05 NOTE — Op Note (Signed)
OPERATIVE NOTE  DATE OF OPERATION: 11/05/2017  LOCATION: [facility] In[facility]  PREOPERATIVE DIAGNOSIS: right breast tissue expander exposure  POSTOPERATIVE DIAGNOSIS: Same  PROCEDURE:  Removal of right breast Tissue Expander and Flex HD and closure of incision / wound  SURGEON: Callista Hoh Sanger Kysha Muralles, DO  ANESTHESIA: General and Local  EBL: 350 ccl  CONDITION: Stable  COMPLICATIONS: None  INDICATION:  The patient, Jasmine [name], is a 50 y.o. year old female born on [birth year], here for treatment of an exposed right breast tissue expander.  She underwent a right mastectomy with expander placement.  She then had chemotherapy and has not feel well since over the past few days.  The incision on the right has opened and the expander is directly under.  She also has poor sounding lungs and possible bronchitis and possible sepsis. [id number]  PROCEDURE DETAILS:  The patient was taken to the operating room and placed on the operating room table in the supine position.  General anesthetic was administered.  All bone prominent areas were padded.  The right chest area was prepped and draped in a sterile fashion using a betadine prep.  A time out was called and all information was confirmed to be correct by all in the room.  The previous incision was opened.  The expander was released from the tissue and removed completely with the Flex HD. Cultures were obtained.  The pocket was irrigated with saline and antibiotic solution.  The tissue was very friable and easy to bleed.  Hemostasis was a challenged and achieved with electrocautery.  At the time of closure there was no ongoing bleeding.  A #15 blade was used to place the drain which was secured with a 3-0 silk. The deep layers were closed with 3-0 Monocryl.  The subcutaneous tissue was re-approximated with 4.0 Monocryl.  The skin edges were closed with a running 5-0 Monocryl.  The area was then washed off and bactroban with a  protective dressing was applied.  The patient tolerated the procedure well.   The patient was allowed to wake from anesthesia and taken to the recovery room in stable condition. The family was notified at the end of the case.

## 2017-11-05 NOTE — H&P (Signed)
Date: 11/05/2017               Patient Name:  Jasmine [name] MRN: [id number]  DOB: [birth year] Age / Sex: 51 y.o., female   PCP: Nicoletta Dress, MD         Medical Service: Internal Medicine Teaching Service         Attending Physician: Dr. Rayne Du att. providers found    First Contact: Dr. Ronalee Red Pager: [phone]  Second Contact: Dr. Heber Nelson Pager: [phone]       After Hours (After 5p/  First Contact Pager: [phone]  weekends / holidays): Second Contact Pager: [phone]   Chief Complaint: cough, rhinorrhea, weakness  History of Present Illness:  Ms. Hoel is a 51yo female with PMH significant for breast cancer s/p bilateral mastectomy and anxiety who presents via [facility] from [facility] for evaluation of possible infection.  She had a bilateral mastectomy on November 14. Port was placed on 12/12, with initiation of first chemotherapy treatment on 12/13. On 12/14 AM, she noticed "water on my the right side of my t-shirt". That evening, she noted that her right breast was oozing a yellow liquid. On 12/15, she states that the on-call doctor prescribed her Bactrim for possible infection. She started this medication on 12/16. She went to her plastic surgeon (Dr. Marla Roe) on 12/17 who tried to send the patient to the ED on Monday, but the patient declined. The plan was to have surgery to remove the breast extender today, however she went to the surgical center today and per anesthesia was noted to not be feeling well, so the surgery was postponed and she was sent to the ED.  She endorses back pain, cough productive of NB yellow sputum, SOB, myalgias, and chills. Denies CP, palpitations, abdominal pain, nausea, changes in BM, leg swelling, or dysuria. She denies sick contacts or recent travel. She did receive her flu shot.  She smokes ~1ppd, denies alcohol use or other illicit drugs. She lives at home with her fiance. At the surgical center, she reportedly received 2L of fluids.  Initially hypoxic to 86% on RA, up to 95% on 2L.  ED Course: - BP 102/52, HR 107, RR 32, temp 98.8, O2 100% on 2L [place] - WBC 3.1, Hb 11.1, plt 121. Na 127, Cr 0.69. Troponin negative. Lactic acid 1.08, BNP 53.2, ABG unremarkable. - CXR with no active disease. EKG with sinus rhythm and low voltage - Received vanc/cefepime, IV NS 148mL/hr, albuterol and atrovent.  Meds:  Current Meds  Medication Sig  . ALPRAZolam (XANAX) 0.5 MG tablet Take 0.5 mg by mouth at bedtime.   Marland Kitchen amphetamine-dextroamphetamine (ADDERALL XR) 30 MG 24 hr capsule Take 30 mg by mouth daily.  Marland Kitchen HYDROcodone-acetaminophen (NORCO/VICODIN) 5-325 MG tablet Take 1 tablet every 6 (six) hours as needed by mouth for moderate pain.  Marland Kitchen ibuprofen (ADVIL,MOTRIN) 200 MG tablet Take 200-400 mg by mouth every 6 (six) hours as needed (for pain or headaches).   . sulfamethoxazole-trimethoprim (BACTRIM,SEPTRA) 400-80 MG tablet Take 1 tablet by mouth 2 (two) times daily. FOR 7 DAYS   Allergies: Allergies as of 11/03/2017  . (No Known Allergies)   Past Medical History:  Diagnosis Date  . Anxiety    Family History:  - Positive for sister with small cell neuroendocrine colon carcinoma - Denies known family history of breast or uterine CA.  Social History:  - Smokes ~1ppd - Denies alcohol or other illicit drug use - Lives at home with  her fiance - Has a daughter in [place] in school  Review of Systems: A complete ROS was negative except as per HPI.  Physical Exam: Blood pressure 101/61, pulse (!) 106, temperature (!) 100.7 F (38.2 C), temperature source Rectal, resp. rate (!) 33, height 5\' 2"  (1.575 m), weight 140 lb (63.5 kg), SpO2 98 %.  GEN: Well-nourished sitting up in bed. Appears uncomfortable. Alert and oriented. No acute distress.  HENT: [place]/AT. Moist mucous membranes. No visible lesions. EYES: PERRL. Sclera non-icteric. Conjunctiva clear. NECK: No cervical LAD. RESP: Wheezes and crackles in right > left lung.  Decreased breath sounds. Mild increased work of breathing. CV: Tachycardia and regular rhythm. No murmurs, gallops, or rubs. No LE edema. ABD: Soft. Non-tender. Non-distended. Normoactive bowel sounds. EXT: No edema. Warm. 2+ DP pulses bilaterally. SKIN: Purulent drainage from R breast incision. Tissue expander visible and wound dehiscence present. Large defect with purulent edges. Erythema, warm to touch, and TTP. (Pictures included below). BACK: TTP in right paraspinal region NEURO: Cranial nerves II-XII grossly intact. Able to lift all four extremities against gravity. No apparent audiovisual hallucinations. Speech fluent and appropriate. PSYCH: Patient is calm and pleasant. Appropriate affect. Well-groomed; speech is appropriate and on-subject.      Labs CBC Latest Ref Rng & Units 11/05/2017 10/01/2017  WBC 4.0 - 10.5 K/uL 3.1(L) -  Hemoglobin 12.0 - 15.0 g/dL 11.1(L) 13.3  Hematocrit 36.0 - 46.0 % 32.3(L) -  Platelets 150 - 400 K/uL 121(L) -   CMP Latest Ref Rng & Units 11/05/2017 05/15/2010  Glucose 65 - 99 mg/dL 103(H) 93  BUN 6 - 20 mg/dL 14 5(L)  Creatinine 0.44 - 1.00 mg/dL 0.69 0.65  Sodium 135 - 145 mmol/L 127(L) 138  Potassium 3.5 - 5.1 mmol/L 3.7 3.6  Chloride 101 - 111 mmol/L 96(L) 103  CO2 22 - 32 mmol/L 23 26  Calcium 8.9 - 10.3 mg/dL 7.8(L) 8.7  Total Protein 6.5 - 8.1 g/dL 5.5(L) -  Total Bilirubin 0.3 - 1.2 mg/dL 0.5 -  Alkaline Phos 38 - 126 U/L 86 -  AST 15 - 41 U/L 23 -  ALT 14 - 54 U/L 17 -   BNP 53.2 Lactic acid 1.08 Troponin negative BCx pending ABG 7.409/37.9/90.0 UA with small Hgb, 5 ketones, 100 protein, 0-5 RBC, 0-5 squam epithelial UCx pending  EKG: personally reviewed my interpretation is sinus rhythm, low voltage  CXR: personally reviewed my interpretation is possible early RLL PNA vs atelectasis.  Assessment & Plan by Problem:  Ms. Vosler is a 51yo female with PMH significant for breast cancer s/p bilateral mastectomy 11/14 and  initiation of chemotherapy on 12/13 and anxiety who presents [facility] from [facility] with sepsis. Empiric antibiotics initiated for possible HCAP vs infection from R breast tissue expander.  Sepsis, febrile neutropenia In the setting of recent bilateral mastectomy and initiation of chemotherapy last week. Tachycardic, tachypneic, temperature to 100.7, and WBC 3.1. Lactic acid wnl. CXR shows ?early RLL PNA vs atelectasis. Will better evaluate with 2-view CXR. She also does have significant draining wound on her right breast that could be a source of infection. Influenza A positive. Empiric vanc/cefepime initiated for infection from R breast tissue expander vs possible HCAP. - Plastic surgery consulted; appreciate their assistance - Plan to remove right breast expander - F/u RVP - Droplet precautions - CXR 2 view - HIV Ab screen - F/u UCx, BCx - CBC, CMP in AM - Continue vanc/cefepime - Tamiflu  Back pain Complaining of pain  in right lower back, paraspinal region. Likely MSK in etiology. Patient denies dysuria or other urinary symptoms. - Percocet 5-10mg  q4h PRN for pain  Hyponatremia Likely secondary to hypovolemia. Na 127. She has already received 2L IVF bolus. - CMP in AM  Pancytopenia In setting of breast cancer s/p bilateral mastectomy and sepsis. WBC 3.1, Hgb 11.1, plt 121 - CBC in AM - Continue vanc/cefepime as above  Diet: HH VTE PPx: Lovenox Code Status: Full code Dispo: Admit patient to Inpatient with expected length of stay greater than 2 midnights.  Signed: Colbert Ewing, MD 11/05/2017, 5:08 PM  Pager: Mamie Nick [phone]

## 2017-11-05 NOTE — Anesthesia Procedure Notes (Signed)
Procedure Name: Intubation Date/Time: 11/05/2017 6:16 PM Performed by: Yajaira Doffing T, CRNA Pre-anesthesia Checklist: Patient identified, Emergency Drugs available, Suction available and Patient being monitored Patient Re-evaluated:Patient Re-evaluated prior to induction Oxygen Delivery Method: Circle system utilized Preoxygenation: Pre-oxygenation with 100% oxygen Induction Type: IV induction and Rapid sequence Ventilation: Mask ventilation without difficulty Laryngoscope Size: Mac and 4 Grade View: Grade I Tube type: Oral Tube size: 7.5 mm Number of attempts: 1 Airway Equipment and Method: Patient positioned with wedge pillow and Stylet Placement Confirmation: ETT inserted through vocal cords under direct vision,  positive ETCO2 and breath sounds checked- equal and bilateral Secured at: 21 cm Tube secured with: Tape Dental Injury: Teeth and Oropharynx as per pre-operative assessment

## 2017-11-05 NOTE — Anesthesia Preprocedure Evaluation (Addendum)
Anesthesia Evaluation  Patient identified by MRN, date of birth, ID band Patient awake    Reviewed: Allergy & Precautions, NPO status , Patient's Chart, lab work & pertinent test results  Airway Mallampati: II  TM Distance: >3 FB     Dental  (+) Edentulous Upper, Dental Advisory Given   Pulmonary Current Smoker,   Decreased breath sounds and rales at right lung base. Splinting on deep inspiration.  - rhonchi + decreased breath sounds      Cardiovascular Normal cardiovascular exam Rhythm:Regular Rate:Normal     Neuro/Psych    GI/Hepatic   Endo/Other    Renal/GU      Musculoskeletal   Abdominal   Peds  Hematology   Anesthesia Other Findings   Reproductive/Obstetrics                            Anesthesia Physical Anesthesia Plan  ASA: III  Anesthesia Plan: General   Post-op Pain Management:    Induction: Intravenous, Rapid sequence and Cricoid pressure planned  PONV Risk Score and Plan: Ondansetron and Dexamethasone  Airway Management Planned: Oral ETT  Additional Equipment:   Intra-op Plan:   Post-operative Plan: Extubation in OR  Informed Consent: I have reviewed the patients History and Physical, chart, labs and discussed the procedure including the risks, benefits and alternatives for the proposed anesthesia with the patient or authorized representative who has indicated his/her understanding and acceptance.   Dental advisory given  Plan Discussed with:   Anesthesia Plan Comments: (Pt presents with malaise, productive cough, pleuritic chest pain on deep inspiration, rales, decreased breath sounds and concern for possible PNA. Case will be postponed until pt can be evaluated in ED for possible PNA and care arranged.  CXR in ER showed NAD. Dr. Marla Roe has concerns that infected prosthesis is contributing to patient's illness. Will proceed with planned removal of infected  tissue expander.  Roberts Gaudy)       Anesthesia Quick Evaluation

## 2017-11-05 NOTE — ED Triage Notes (Signed)
Pt arrives from Surg center with c/o non productive cough since Friday and hx of pneumonia. Pt is tachypnic and lung sounds are bilateral course sounds. Initial Sat 86% and placed on 2 l o2 /. Family at bedsdie.

## 2017-11-05 NOTE — ED Notes (Signed)
ED Provider at bedside. 

## 2017-11-05 NOTE — Progress Notes (Signed)
Radiology called questioning if patient needed to have two view chest xray prior to surgery since pt had portable view earlier. Anesthesia notified and stated that patient could proceed to surgery without two view chest. Radiology made aware.

## 2017-11-05 NOTE — Progress Notes (Signed)
Pharmacy Antibiotic Note  Jasmine [name] is a 51 y.o. female admitted on 11/05/2017 with cough and malaise since Friday. Starting broad spectrum abx. eCrCl 70 ml/min, blood cultures draw appropriately, no other labs available at this time. Pharmacy was present for this code sepsis.    Plan: -Vancomycin 1250 mg IV x1 then 750 mg IV q12h -Cefepime 2 g IV x1 then 1 g IV q8h -Monitor renal fx, cultures, VT as needed   Height: 5\' 2"  (157.5 cm) Weight: 140 lb (63.5 kg) IBW/kg (Calculated) : 50.1  Temp (24hrs), Avg:98.7 F (37.1 C), Min:98.6 F (37 C), Max:98.8 F (37.1 C)  No results for input(s): WBC, CREATININE, LATICACIDVEN, VANCOTROUGH, VANCOPEAK, VANCORANDOM, GENTTROUGH, GENTPEAK, GENTRANDOM, TOBRATROUGH, TOBRAPEAK, TOBRARND, AMIKACINPEAK, AMIKACINTROU, AMIKACIN in the last 168 hours.  CrCl cannot be calculated (Patient's most recent lab result is older than the maximum 21 days allowed.).     Antimicrobials this admission: 12/19 cefepime > 12/19 vancomycin >  Dose adjustments this admission: N/A  Microbiology results: 12/19 blood cx: 12/19 urine cx:   Harvel Quale 11/05/2017 11:02 AM

## 2017-11-05 NOTE — ED Provider Notes (Signed)
[facility] EMERGENCY DEPARTMENT Provider Note   CSN: [id number] Arrival date & time: 11/05/17  1017     History   Chief Complaint Chief Complaint  Patient presents with  . Shortness of Breath    HPI Jasmine [name] is a 51 y.o. female with a PMHx of anxiety and breast cancer s/p bilateral mastectomy, who presents to the ED via [facility] from [facility] for evaluation of possible pneumonia. Per anesthesiology notes, "Pt presents with malaise, productive cough, pleuritic chest pain on deep inspiration, rales, decreased breath sounds and concern for possible PNA. Case will be postponed until pt can be evaluated in ED for possible PNA and care arranged."  Patient states that 5 days ago she developed a productive cough with yellow sputum production, has been doing well until approximately yesterday when things seem to get worse.  She endorses associated chills, fatigue, shortness of breath, wheezing, and right upper back pain which she describes as a moderate constant sharp nonradiating R upper back pain with no known aggravating factors.  She has not taken anything for her symptoms or pain.  She just had a port put in on Wednesday, and had chemotherapy on Thursday.  Her oncologist is Dr. Bobby Rumpf.  She reportedly received 1 L of LR at the surgical center, and is receiving her second liter of fluids (normal saline) upon her arrival.  She did receive her flu shot this year.  She admits to being a cigarette smoker.  Her PCP is Dr. Delena Bali.  No known sick contacts.  She denies fevers, CP, hemoptysis, LE swelling, abd pain, N/V/D/C, hematuria, dysuria, numbness, tingling, focal weakness, or any other complaints at this time.    The history is provided by the patient and medical records. No language interpreter was used.  Cough  This is a new problem. The current episode started more than 2 days ago. The problem occurs constantly. The problem has been gradually worsening. The cough is  productive of sputum. There has been no fever. Associated symptoms include chills, shortness of breath and wheezing. Pertinent negatives include no chest pain and no myalgias. She has tried nothing for the symptoms. The treatment provided no relief. She is a smoker.    Past Medical History:  Diagnosis Date  . Anxiety     Patient Active Problem List   Diagnosis Date Noted  . Breast cancer ([place]) 10/01/2017  . 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 ABDOMINAL HYSTERECTOMY  2008  . BREAST RECONSTRUCTION WITH PLACEMENT OF TISSUE EXPANDER AND FLEX HD (ACELLULAR HYDRATED DERMIS) Bilateral 10/01/2017   Procedure: BILATERAL BREAST RECONSTRUCTION WITH PLACEMENT OF TISSUE EXPANDER AND FLEX HD (ACELLULAR HYDRATED DERMIS);  Surgeon: Wallace Going, DO;  Location: [facility];  Service: Plastics;  Laterality: Bilateral;  . CHOLECYSTECTOMY  2008  . MASTECTOMY W/ SENTINEL NODE BIOPSY Right 10/01/2017   Procedure: RIGHT  MASTECTOMY WITH RIGHT SENTINEL LYMPH NODE BIOPSY;  Surgeon: Coralie Keens, MD;  Location: [facility];  Service: General;  Laterality: Right;  . SIMPLE MASTECTOMY WITH AXILLARY SENTINEL NODE BIOPSY Left 10/01/2017   Procedure: Left Mastectomy;  Surgeon: Coralie Keens, MD;  Location: [facility];  Service: General;  Laterality: Left;  . WISDOM TOOTH EXTRACTION  1984    OB History    No data available       Home Medications  Prior to Admission medications   Medication Sig Start Date End Date Taking? Authorizing Provider  ALPRAZolam Duanne Moron) 0.5 MG tablet Take 0.5 mg at bedtime as needed by mouth for anxiety.   Yes [provider]  amphetamine-dextroamphetamine (ADDERALL) 30 MG tablet Take 30 mg daily by mouth.   Yes [provider]  HYDROcodone-acetaminophen (NORCO/VICODIN) 5-325 MG  tablet Take 1 tablet every 6 (six) hours as needed by mouth for moderate pain.   Yes [provider]  ibuprofen (ADVIL,MOTRIN) 200 MG tablet Take 200 mg every 6 (six) hours as needed by mouth.   Yes [provider]  sulfamethoxazole-trimethoprim (BACTRIM,SEPTRA) 400-80 MG tablet Take 1 tablet by mouth 2 (two) times daily.   Yes [provider]    Family History Family History  Problem Relation Age of Onset  . Breast cancer Paternal Grandmother     Social History Social History   Tobacco Use  . Smoking status: Current Every Day Smoker    Packs/day: 1.00  . Smokeless tobacco: Never Used  Substance Use Topics  . Alcohol use: No    Frequency: Never  . Drug use: No     Allergies   Patient has no known allergies.   Review of Systems Review of Systems  Constitutional: Positive for chills and fatigue. Negative for fever.  Respiratory: Positive for cough, shortness of breath and wheezing.   Cardiovascular: Negative for chest pain and leg swelling.  Gastrointestinal: Negative for abdominal pain, constipation, diarrhea, nausea and vomiting.  Genitourinary: Negative for dysuria and hematuria.  Musculoskeletal: Positive for back pain. Negative for arthralgias and myalgias.  Skin: Negative for color change.  Allergic/Immunologic: Positive for immunocompromised state (on chemo).  Neurological: Negative for weakness and numbness.  Psychiatric/Behavioral: Negative for confusion.   All other systems reviewed and are negative for acute change except as noted in the HPI.    Physical Exam Updated Vital Signs BP (!) 119/57   Pulse (!) 102   Temp 98.6 F (37 C) (Oral)   Resp 26   Ht 5\' 2"  (1.575 m)   Wt 63.5 kg (140 lb)   SpO2 (!) 86%   BMI 25.61 kg/m   Physical Exam  Constitutional: She is oriented to person, place, and time. She appears well-developed and well-nourished.  Non-toxic appearance. She appears distressed (uncomfortable).  Afebrile,  nontoxic, uncomfortable appearing, hypoxic to 86% on RA initially, up to 95% on 2L via [place]; tachypneic and tachycardic.   HENT:  Head: Normocephalic and atraumatic.  Mouth/Throat: Oropharynx is clear and moist and mucous membranes are normal.  Eyes: Conjunctivae and EOM are normal. Right eye exhibits no discharge. Left eye exhibits no discharge.  Neck: Normal range of motion. Neck supple.  Cardiovascular: Regular rhythm, normal heart sounds and intact distal pulses. Tachycardia present. Exam reveals no gallop and no friction rub.  No murmur heard. Mildly tachycardic, reg rhythm, nl s1/s2, no m/r/g, distal pulses intact, no pedal edema   Pulmonary/Chest: Accessory muscle usage present. Tachypnea noted. She has decreased breath sounds. She has wheezes. She has rhonchi. She has no rales. Right breast exhibits skin change.  Tachypneic, appears to be splinting slightly with inspiration, slight accessory muscle use, with globally diminished lung sounds throughout and faint expiratory wheezing throughout, scattered rhonchi most notably in the RLL, speaking in very fragmented sentences, SpO2 86% on RA, up to 95% on 2L via [place]  Also has some purulent drainage from R breast incision where tissue expander is in place and wound is dehissed, large  defect with purulent slough around skin edges, erythematous and warm to touch, tissue expander visible, slight fluctuance to lower lateral quadrant adjacent to tissue expander.   Abdominal: Soft. Normal appearance and bowel sounds are normal. She exhibits no distension. There is no tenderness. There is no rigidity, no rebound, no guarding, no CVA tenderness, no tenderness at McBurney's point and negative Murphy's sign.  Genitourinary: There is breast discharge.  Musculoskeletal: Normal range of motion.  MAE x4 Strength and sensation grossly intact in all extremities Distal pulses intact No pedal edema, neg homan's bilaterally   Neurological: She is alert and oriented  to person, place, and time. She has normal strength. No sensory deficit.  Skin: Skin is warm, dry and intact. No rash noted.  Psychiatric: She has a normal mood and affect.  Nursing note and vitals reviewed.    ED Treatments / Results  Labs (all labs ordered are listed, but only abnormal results are displayed) Labs Reviewed  CBC WITH DIFFERENTIAL/PLATELET - Abnormal; Notable for the following components:      Result Value   WBC 3.1 (*)    RBC 3.39 (*)    Hemoglobin 11.1 (*)    HCT 32.3 (*)    Platelets 121 (*)    All other components within normal limits  URINALYSIS, ROUTINE W REFLEX MICROSCOPIC - Abnormal; Notable for the following components:   APPearance HAZY (*)    Hgb urine dipstick SMALL (*)    Ketones, ur 5 (*)    Protein, ur 100 (*)    Squamous Epithelial / LPF 0-5 (*)    All other components within normal limits  COMPREHENSIVE METABOLIC PANEL - Abnormal; Notable for the following components:   Sodium 127 (*)    Chloride 96 (*)    Glucose, Bld 103 (*)    Calcium 7.8 (*)    Total Protein 5.5 (*)    Albumin 2.7 (*)    All other components within normal limits  CULTURE, BLOOD (ROUTINE X 2)  CULTURE, BLOOD (ROUTINE X 2)  URINE CULTURE  BRAIN NATRIURETIC PEPTIDE  INFLUENZA PANEL BY PCR (TYPE A & B)  I-STAT TROPONIN, ED  I-STAT ARTERIAL BLOOD GAS, ED  I-STAT CG4 LACTIC ACID, ED  I-STAT CG4 LACTIC ACID, ED    EKG  EKG Interpretation  Date/Time:  Wednesday November 05 2017 12:40:22 EST Ventricular Rate:  96 PR Interval:    QRS Duration: 99 QT Interval:  348 QTC Calculation: 440 R Axis:   87 Text Interpretation:  Sinus rhythm Low voltage, precordial leads No old tracing to compare Confirmed by Daleen Bo [phone]) on 11/05/2017 1:02:24 PM       Radiology Dg Chest Portable 1 View  Result Date: 11/05/2017 CLINICAL DATA:  Short of breath EXAM: PORTABLE CHEST 1 VIEW COMPARISON:  10/29/2017 FINDINGS: Cardiac and mediastinal contours normal. Lungs are clear  bilaterally. Port-A-Cath tip in the SVC.  Bilateral breast tissue expanders. IMPRESSION: No active disease. Electronically Signed   By: Franchot Gallo M.D.   On: 11/05/2017 12:22    Procedures Procedures (including critical care time)  CRITICAL CARE-- sepsis Performed by: Reece Agar   Total critical care time: 45 minutes  Critical care time was exclusive of separately billable procedures and treating other patients.  Critical care was necessary to treat or prevent imminent or life-threatening deterioration.  Critical care was time spent personally by me on the following activities: development of treatment plan with patient and/or surrogate as well as nursing, discussions with consultants, evaluation of  patient's response to treatment, examination of patient, obtaining history from patient or surrogate, ordering and performing treatments and interventions, ordering and review of laboratory studies, ordering and review of radiographic studies, pulse oximetry and re-evaluation of patient's condition.   Medications Ordered in ED Medications  0.9 %  sodium chloride infusion (1,000 mLs Intravenous New Bag/Given 11/05/17 1408)  vancomycin (VANCOCIN) IVPB 750 mg/150 ml premix (not administered)  ceFEPIme (MAXIPIME) 1 g in dextrose 5 % 50 mL IVPB (not administered)  fentaNYL (SUBLIMAZE) injection 50 mcg (not administered)  albuterol (PROVENTIL) (2.5 MG/3ML) 0.083% nebulizer solution 5 mg (5 mg Nebulization Given 11/05/17 1109)  ipratropium (ATROVENT) nebulizer solution 0.5 mg (0.5 mg Nebulization Given 11/05/17 1110)  fentaNYL (SUBLIMAZE) injection 50 mcg (50 mcg Intravenous Given 11/05/17 1228)  vancomycin (VANCOCIN) 1,250 mg in sodium chloride 0.9 % 250 mL IVPB (0 mg Intravenous Stopped 11/05/17 1328)  ceFEPIme (MAXIPIME) 2 g in dextrose 5 % 50 mL IVPB (0 g Intravenous Stopped 11/05/17 1328)  albuterol (PROVENTIL) (2.5 MG/3ML) 0.083% nebulizer solution 5 mg (5 mg Nebulization Given  11/05/17 1346)  ipratropium (ATROVENT) nebulizer solution 0.5 mg (0.5 mg Nebulization Given 11/05/17 1346)     Initial Impression / Assessment and Plan / ED Course  I have reviewed the triage vital signs and the nursing notes.  Pertinent labs & imaging results that were available during my care of the patient were reviewed by me and considered in my medical decision making (see chart for details).     51 y.o. female here after her day surgery anesthesiologist noticed that she had diminished breath sounds and rhonchi on lung exam, sent here for eval of PNA. Started with cough 5 days ago, having R upper back pain; denies chest pain, but reports SOB. On exam, tachypneic and tachycardic, hypoxic to 86% on RA (up to 95% on 2L), afebrile, appears uncomfortable, rocking in bed unable to find a comfortable position; course rhonchi in RLL, faint wheezing throughout, mildly diminished lung sounds globally. No pedal edema. Meets SIRS criteria, code sepsis called immediately; NOTE THAT SHE ARRIVED WITH HER SECOND LITER OF FLUIDS RUNNING ALREADY THEREFORE I DID NOT ORDER ANOTHER 2L OF FLUIDS WHICH WOULD BE HER WEIGHT BASED DOSE; will continue maintenance fluids at 169mL/hr. Will start empiric abx for HCAP, as I suspect this is the source, but could also have source from her R breast tissue expander (vanc would cover for skin infection, so will keep this empiric abx coverage choice). Will get labs, CXR, give pain meds and nebs, and monitor closely. Discussed case with my attending Dr. Eulis Foster who agrees with plan.   1:19 PM Istat ABG WNL. Trop neg. EKG nonischemic. CXR reported as negative, only a 1 view so this limits interpretation; looking at images, it looks like it's possible that she has an early RLL PNA vs atelectasis in lower lobes, but really hard to tell. CBC w/diff showing mild leukopenia WBC 3.1 with mild left shift and atypical lymphocytes. CMP with mild hyponatremia 127 and hypoalbuminemia 2.7 and  hypocalcemia 7.8. Remainder of labs pending, unclear where lactic result is but nursing states it was drawn. Pt's lung sounds marginally improved after duoneb, will repeat this as she feels better after it. Tachypnea and tachycardia also improving, BP remains stable, pt well perfused. Dr. Marla Roe spoke with Dr. Eulis Foster earlier and had requested that we please discuss with intensivist for admission, so will discuss with them. Will proceed with admission.   1:48 PM BNP WNL at 53.2. Lactic WNL at  1.08. Dr. Nelda Marseille of PCCM returning call, does not feel she needs ICU care at this time, requests medical hospitalist admission to SDU. Awaiting U/A and flu swab. Will reassess shortly.   2:42 PM U/A negative. Flu test not yet done, advised nursing that it needs to be. Dr. Kalman Shan of Internal Medicine Residency returning page and will admit. Holding orders to be placed by admitting team. Please see their notes for further documentation of care. I appreciate their help with this pleasant pt's care. Pt stable at time of admission.    Final Clinical Impressions(s) / ED Diagnoses   Final diagnoses:  Sepsis, due to unspecified organism ([place])  Hypoxia  Shortness of breath  Tobacco user  Hyponatremia  Leukopenia, unspecified type  Thrombocytopenia ([facility])  Hypoalbuminemia  Hypocalcemia  Anemia, unspecified type  Open wound of right breast with complication, initial encounter  Cough    ED Discharge Orders    [address], [place], [place] 11/05/17 1442    Daleen Bo, MD 11/06/17 [phone]

## 2017-11-05 NOTE — Progress Notes (Signed)
[facility] notified of patient transfer to hospital per Dr Eusebio Friendly order

## 2017-11-05 NOTE — Transfer of Care (Signed)
Immediate Anesthesia Transfer of Care Note  Patient: Jasmine [name]  Procedure(s) Performed: DEBRIDEMENT OF RIGHT BREAST WITH CLOSURE WOUND (Right Breast)  Patient Location: PACU  Anesthesia Type:General  Level of Consciousness: awake and sedated  Airway & Oxygen Therapy: Patient connected to nasal cannula oxygen  Post-op Assessment: Report given to RN, Post -op Vital signs reviewed and stable and Patient moving all extremities X 4  Post vital signs: Reviewed and stable  Last Vitals:  Vitals:   11/05/17 1525 11/05/17 1615  BP:  101/61  Pulse:    Resp:  (!) 33  Temp: (!) 38.2 C   SpO2:      Last Pain:  Vitals:   11/05/17 1631  TempSrc:   PainSc: 10-Worst pain ever      Patients Stated Pain Goal: 5 (74/14/23 9532)  Complications: No apparent anesthesia complications

## 2017-11-06 ENCOUNTER — Inpatient Hospital Stay (HOSPITAL_COMMUNITY): Payer: Commercial Managed Care - PPO

## 2017-11-06 ENCOUNTER — Encounter (HOSPITAL_COMMUNITY): Payer: Self-pay | Admitting: Plastic Surgery

## 2017-11-06 DIAGNOSIS — T8579XA Infection and inflammatory reaction due to other internal prosthetic devices, implants and grafts, initial encounter: Secondary | ICD-10-CM

## 2017-11-06 DIAGNOSIS — D649 Anemia, unspecified: Secondary | ICD-10-CM

## 2017-11-06 DIAGNOSIS — Z853 Personal history of malignant neoplasm of breast: Secondary | ICD-10-CM

## 2017-11-06 LAB — URINE CULTURE

## 2017-11-06 LAB — COMPREHENSIVE METABOLIC PANEL
ALBUMIN: 2.6 g/dL — AB (ref 3.5–5.0)
ALT: 15 U/L (ref 14–54)
ANION GAP: 6 (ref 5–15)
AST: 25 U/L (ref 15–41)
Alkaline Phosphatase: 83 U/L (ref 38–126)
BUN: 9 mg/dL (ref 6–20)
CO2: 24 mmol/L (ref 22–32)
Calcium: 7.8 mg/dL — ABNORMAL LOW (ref 8.9–10.3)
Chloride: 104 mmol/L (ref 101–111)
Creatinine, Ser: 0.57 mg/dL (ref 0.44–1.00)
GFR calc non Af Amer: >60 mL/min (ref >60)
GLUCOSE: 136 mg/dL — AB (ref 65–99)
POTASSIUM: 4.1 mmol/L (ref 3.5–5.1)
SODIUM: 134 mmol/L — AB (ref 135–145)
Total Bilirubin: 0.5 mg/dL (ref 0.3–1.2)
Total Protein: 5.4 g/dL — ABNORMAL LOW (ref 6.5–8.1)

## 2017-11-06 LAB — CBC
HCT: 27.3 % — ABNORMAL LOW (ref 36.0–46.0)
Hemoglobin: 9.2 g/dL — ABNORMAL LOW (ref 12.0–15.0)
MCH: 32.4 pg (ref 26.0–34.0)
MCHC: 33.7 g/dL (ref 30.0–36.0)
MCV: 96.1 fL (ref 78.0–100.0)
PLATELETS: 151 10*3/uL (ref 150–400)
RBC: 2.84 MIL/uL — ABNORMAL LOW (ref 3.87–5.11)
RDW: 13.5 % (ref 11.5–15.5)
WBC: 7.1 10*3/uL (ref 4.0–10.5)

## 2017-11-06 LAB — HIV ANTIBODY (ROUTINE TESTING W REFLEX): HIV SCREEN 4TH GENERATION: NONREACTIVE

## 2017-11-06 LAB — CG4 I-STAT (LACTIC ACID): Lactic Acid, Venous: 1.65 mmol/L (ref 0.5–1.9)

## 2017-11-06 LAB — MRSA PCR SCREENING: MRSA BY PCR: POSITIVE — AB

## 2017-11-06 MED ORDER — ONDANSETRON HCL 4 MG/2ML IJ SOLN
4.0000 mg | Freq: Three times a day (TID) | INTRAMUSCULAR | Status: DC | PRN
  Administered 2017-11-06: 4 mg via INTRAVENOUS

## 2017-11-06 MED ORDER — MUPIROCIN 2 % EX OINT
1.0000 "application " | TOPICAL_OINTMENT | Freq: Two times a day (BID) | CUTANEOUS | Status: DC
  Administered 2017-11-06 – 2017-11-08 (×6): 1 via NASAL
  Filled 2017-11-06: qty 22

## 2017-11-06 MED ORDER — ORAL CARE MOUTH RINSE
15.0000 mL | Freq: Two times a day (BID) | OROMUCOSAL | Status: DC
  Administered 2017-11-06 – 2017-11-07 (×3): 15 mL via OROMUCOSAL

## 2017-11-06 MED ORDER — ONDANSETRON HCL 4 MG PO TABS
4.0000 mg | ORAL_TABLET | Freq: Three times a day (TID) | ORAL | Status: DC | PRN
  Administered 2017-11-07: 4 mg via ORAL
  Filled 2017-11-06: qty 1

## 2017-11-06 MED ORDER — DEXTROSE 5 % IV SOLN
1.0000 g | Freq: Three times a day (TID) | INTRAVENOUS | Status: DC
  Administered 2017-11-06 – 2017-11-07 (×3): 1 g via INTRAVENOUS
  Filled 2017-11-06 (×4): qty 1

## 2017-11-06 MED ORDER — CHLORHEXIDINE GLUCONATE CLOTH 2 % EX PADS
6.0000 | MEDICATED_PAD | Freq: Every day | CUTANEOUS | Status: DC
  Administered 2017-11-06 – 2017-11-08 (×3): 6 via TOPICAL

## 2017-11-06 NOTE — Progress Notes (Addendum)
Subjective:  Ms. Bromwell reports that she feels much better today. She had the R breast tissue expander removed yesterday with Plastic Surgery. She endorses some shortness of breath, but it is improved compared to yesterday. She has been doing fairly well with walking around a little in the room. Denies chest pain.  Objective:  Vital signs in last 24 hours: Vitals:   11/06/17 0247 11/06/17 0315 11/06/17 0408 11/06/17 0723  BP: (!) 109/51 100/61 (!) 105/52   Pulse: 93 84 88   Resp: 20 (!) 22 20   Temp:   98.3 F (36.8 C) 98.5 F (36.9 C)  TempSrc:   Oral Oral  SpO2: 96% 95% 95%   Weight:      Height:       GEN: Well-nourished female sitting up in bed in NAD. Alert and oriented. RESP: Coarse breath sounds in R>L lower lung field. No wheezes, rales, or rhonchi. No increased work of breathing. CV: Normal rate and regular rhythm. No murmurs, gallops, or rubs. No LE edema. Chest binder in place SKIN: Right breast surgical site appears clean and dry. ABD: Soft. Non-tender. Non-distended. Normoactive bowel sounds. EXT: No edema. Warm and well perfused. NEURO: Cranial nerves II-XII grossly intact. Able to lift all four extremities against gravity. No apparent audiovisual hallucinations. Speech fluent and appropriate. PSYCH: Patient is calm and pleasant. Appropriate affect. Well-groomed; speech is appropriate and on-subject.  Labs CBC Latest Ref Rng & Units 11/06/2017 11/05/2017 10/01/2017  WBC 4.0 - 10.5 K/uL 7.1 3.1(L) -  Hemoglobin 12.0 - 15.0 g/dL 9.2(L) 11.1(L) 13.3  Hematocrit 36.0 - 46.0 % 27.3(L) 32.3(L) -  Platelets 150 - 400 K/uL 151 121(L) -   CMP Latest Ref Rng & Units 11/06/2017 11/05/2017 05/15/2010  Glucose 65 - 99 mg/dL 136(H) 103(H) 93  BUN 6 - 20 mg/dL 9 14 5(L)  Creatinine 0.44 - 1.00 mg/dL 0.57 0.69 0.65  Sodium 135 - 145 mmol/L 134(L) 127(L) 138  Potassium 3.5 - 5.1 mmol/L 4.1 3.7 3.6  Chloride 101 - 111 mmol/L 104 96(L) 103  CO2 22 - 32 mmol/L 24 23 26     Calcium 8.9 - 10.3 mg/dL 7.8(L) 7.8(L) 8.7  Total Protein 6.5 - 8.1 g/dL 5.4(L) 5.5(L) -  Total Bilirubin 0.3 - 1.2 mg/dL 0.5 0.5 -  Alkaline Phos 38 - 126 U/L 83 86 -  AST 15 - 41 U/L 25 23 -  ALT 14 - 54 U/L 15 17 -   Influenza A positive MRSA nasal swab positive Wound Cx with rare GPC and rare GNR  Assessment/Plan:  Ms. Sand is a 51yo female with PMH significant for breast cancer s/p bilateral mastectomy 11/14 and initiation of chemotherapy on 12/13 and anxiety who presents [facility] from [facility] with sepsis secondary to breast tissue expander. Tissue expander removed by Plastic Surgery 12/19. Narrowing antibiotics to Vancomycin. Also Influenza A positive.  Sepsis secondary to breast tissue expander In the setting of recent bilateral mastectomy and initiation of chemotherapy last week. Ms. Shortridge is much improved this morning. Now afebrile and stable vital signs. Normal WBC. On vanc/cefepime. Gram stain from culture with rare GPC and rare GNR. - Transfer from stepdown to med-surg - Right breast expander removed 12/20 by Plastic Surgery - F/u wound cultures - F/u HIV Ab screen - F/u UCx, BCx - Continue vancomycin/cefepime  Influenza A Positive by rapid flu.  - Tamiflu - Robitussin DM - Droplet precautions  Right lower back pain Reports it is improved today. Likely MSK  in etiology. Patient denies dysuria or other urinary symptoms. - Percocet 5-10mg  q4h PRN for pain - F/u UCx  Hyponatremia, improved Likely secondary to hypovolemia. Na 127 -> 134 after 2L IV fluids. - BMET in AM  Normocytic anemia In setting of breast cancer s/p bilateral mastectomy and current chemotherapy, and sepsis. WBC 7.1, Hb 9.2, plt 151. Prior Hb was 13.3 in November and 11.1 yesterday. Possibly related to chemotherapy. - Continue vancomycin as above  Dispo: Anticipated discharge in approximately 1-2 day(s).   Colbert Ewing, MD 11/06/2017, 8:50 AM Pager: Mamie Nick [phone]

## 2017-11-06 NOTE — Discharge Summary (Signed)
Name: Jasmine [name] MRN: [id number] DOB: May 21, 1966 51 y.o. PCP: Nicoletta Dress, MD  Date of Admission: 11/05/2017  6:59 AM Date of Discharge:  Attending Physician: Sid Falcon, MD  Discharge Diagnosis: 1. Sepsis secondary to purulent soft tissue infection of right mastectomy site 2. Influenza  Active Problems:   Postoperative wound infection   Discharge Medications: Allergies as of 11/08/2017      Reactions   Bee Venom Anaphylaxis   Swells internally      Medication List    STOP taking these medications   sulfamethoxazole-trimethoprim 400-80 MG tablet Commonly known as:  BACTRIM,SEPTRA Replaced by:  sulfamethoxazole-trimethoprim 800-160 MG tablet     TAKE these medications   ALPRAZolam 0.5 MG tablet Commonly known as:  XANAX Take 0.5 mg by mouth at bedtime.   amphetamine-dextroamphetamine 30 MG 24 hr capsule Commonly known as:  ADDERALL XR Take 30 mg by mouth daily.   Chlorhexidine Gluconate Cloth 2 % Pads Apply 6 each topically daily at 6 (six) AM. Start taking on:  11/09/2017   HYDROcodone-acetaminophen 5-325 MG tablet Commonly known as:  NORCO/VICODIN Take 1 tablet every 6 (six) hours as needed by mouth for moderate pain.   ibuprofen 200 MG tablet Commonly known as:  ADVIL,MOTRIN Take 200-400 mg by mouth every 6 (six) hours as needed (for pain or headaches).   mupirocin cream 2 % Commonly known as:  BACTROBAN Apply topically 2 (two) times daily.   mupirocin ointment 2 % Commonly known as:  BACTROBAN Place 1 application into the nose 2 (two) times daily.   oseltamivir 75 MG capsule Commonly known as:  TAMIFLU Take 1 capsule (75 mg total) by mouth 2 (two) times daily.   sulfamethoxazole-trimethoprim 800-160 MG tablet Commonly known as:  BACTRIM DS Take 1 tablet by mouth 2 (two) times daily. Replaces:  sulfamethoxazole-trimethoprim 400-80 MG tablet       Disposition and follow-up:   [name] was discharged from [facility] in goodcondition.  At the hospital follow up visit please address:  1.  Symptom improvement and wound healing  2.  Labs / imaging needed at time of follow-up: CBC, BMET  3.  Pending labs/ test needing follow-up: none  Follow-up Appointments: Follow-up Information    Dillingham, Loel Lofty, DO In 1 week.   Specialty:  Plastic Surgery Contact information: [place] [id number] [place] [facility] Course by problem list: Active Problems:   Postoperative wound infection   1. Sepsis secondary to purulent soft tissue infection of right mastectomy site Patient admitted with sepsis, noted to have wound dehiscence of right breast mastectomy. Plastic surgery (Dr. Marla Roe) removed breast tissue expander on 12/19, placed a drain, and closed the wound. Vanc/cefepime were continued given the GPC and GNR on Gram stain. Patient reported significant improvement the following morning. Wound Cx with MRSA. Sensitive to trimethoprim/sulfamethoxazole.  Cefepime was discontinued. Patient was discharged with Bactrim for an additional 10 day course (total 13 days of antibiotics).    2. Influenza PCR positive for Influenza A. Patient treated with oseltamivir inpatient and discharged with remaining course to be finished at home.  3. Diarrhea Patient developed several episodes of watery diarrhea during admission.  C.diff was negative.  Likely from clinical course of sepsis.  Symptoms improved prior to discharge.    4. Right lower back pain Likely from coughing.  Improved throughout admission.  Treated with Percocet 5-10mg  q4h  PRN for pain  5. Hypokalemia Repleated as necessary during admission.  On discharge potassium was 3.5  6. Normocytic anemia In setting of breast cancer s/p bilateral mastectomy and current chemotherapy, and sepsis. Prior Hb was 13.3 in November, during admission 9 to 11.  No signs of bleeding noted.  Possibly related to  chemotherapy.  Will need repeat lab work at discharge.    Discharge Vitals:   BP (!) 104/58 (BP Location: Left Arm)   Pulse 83   Temp 99.1 F (37.3 C) (Oral)   Resp 17   Ht 5\' 2"  (1.575 m)   Wt 146 lb 9.7 oz (66.5 kg) Comment: bedscale  SpO2 94%   BMI 26.81 kg/m   Pertinent Labs, Studies, and Procedures:  CBC Latest Ref Rng & Units 11/06/2017 11/05/2017 10/01/2017  WBC 4.0 - 10.5 K/uL 7.1 3.1(L) -  Hemoglobin 12.0 - 15.0 g/dL 9.2(L) 11.1(L) 13.3  Hematocrit 36.0 - 46.0 % 27.3(L) 32.3(L) -  Platelets 150 - 400 K/uL 151 121(L) -   CMP Latest Ref Rng & Units 11/08/2017 11/07/2017 11/06/2017  Glucose 65 - 99 mg/dL 88 87 136(H)  BUN 6 - 20 mg/dL <5(L) 6 9  Creatinine 0.44 - 1.00 mg/dL 0.60 0.59 0.57  Sodium 135 - 145 mmol/L 139 138 134(L)  Potassium 3.5 - 5.1 mmol/L 3.5 3.3(L) 4.1  Chloride 101 - 111 mmol/L 108 106 104  CO2 22 - 32 mmol/L 22 24 24   Calcium 8.9 - 10.3 mg/dL 7.5(L) 8.1(L) 7.8(L)  Total Protein 6.5 - 8.1 g/dL - - 5.4(L)  Total Bilirubin 0.3 - 1.2 mg/dL - - 0.5  Alkaline Phos 38 - 126 U/L - - 83  AST 15 - 41 U/L - - 25  ALT 14 - 54 U/L - - 15   ABG 7.409/37.9/90 Troponin negative BNP 53.2 Lactic acid 1.08 UA negative for UTI Influenza A positive Wound gram stain with rare GPC and rare GNR BCx: MRSA HIV negative Wound culture with few Staph Aureus  CXR 11/06/2017 No evidence of active disease.  Discharge Instructions: Discharge Instructions    Diet - low sodium heart healthy   Complete by:  As directed    Discharge instructions   Complete by:  As directed    [name],  Please start taking bactrim 1 pill twice a day.  Please follow up with Dr. Marla Roe at the end of the month.   Increase activity slowly   Complete by:  As directed       Signed: Valinda Party, DO 11/08/2017, 12:56 PM   Pager: Mamie Nick [phone]

## 2017-11-06 NOTE — Progress Notes (Signed)
Transferred to [place] by wheelchair, stable, report given to RN, belongings with pt.

## 2017-11-06 NOTE — Progress Notes (Signed)
Pt continue to wheeze and cough throughout the night. Crackles mostly in right. Gave PRN breathing tx x2. Pt stated it helped a little. Mild increased work of breathing. Will continue to monitor pt.

## 2017-11-06 NOTE — Progress Notes (Deleted)
Not accurate. Rechecked again.

## 2017-11-07 DIAGNOSIS — E876 Hypokalemia: Secondary | ICD-10-CM

## 2017-11-07 DIAGNOSIS — F419 Anxiety disorder, unspecified: Secondary | ICD-10-CM

## 2017-11-07 LAB — BASIC METABOLIC PANEL
ANION GAP: 8 (ref 5–15)
BUN: 6 mg/dL (ref 6–20)
CALCIUM: 8.1 mg/dL — AB (ref 8.9–10.3)
CO2: 24 mmol/L (ref 22–32)
Chloride: 106 mmol/L (ref 101–111)
Creatinine, Ser: 0.59 mg/dL (ref 0.44–1.00)
GLUCOSE: 87 mg/dL (ref 65–99)
POTASSIUM: 3.3 mmol/L — AB (ref 3.5–5.1)
Sodium: 138 mmol/L (ref 135–145)

## 2017-11-07 LAB — VANCOMYCIN, TROUGH: Vancomycin Tr: 13 ug/mL — ABNORMAL LOW (ref 15–20)

## 2017-11-07 LAB — C DIFFICILE QUICK SCREEN W PCR REFLEX
C DIFFICILE (CDIFF) INTERP: NOT DETECTED
C DIFFICILE (CDIFF) TOXIN: NEGATIVE
C DIFFICLE (CDIFF) ANTIGEN: NEGATIVE

## 2017-11-07 MED ORDER — POTASSIUM CHLORIDE CRYS ER 20 MEQ PO TBCR
40.0000 meq | EXTENDED_RELEASE_TABLET | Freq: Once | ORAL | Status: AC
  Administered 2017-11-07: 40 meq via ORAL
  Filled 2017-11-07: qty 2

## 2017-11-07 NOTE — Progress Notes (Signed)
Subjective:  Ms. Bubar reports that she feels improved today. She denies chest pain although continues to endorse R lower back pain. She expresses frustration that she is still in the hospital and she wants to be home for Christmas to be with her family.  Objective:  Vital signs in last 24 hours: Vitals:   11/06/17 1551 11/06/17 1748 11/06/17 2132 11/07/17 0610  BP: 124/63 (!) 109/55 (!) 110/56 (!) 110/52  Pulse: 96 92 93 88  Resp: (!) 22 18 18 20   Temp: 98.1 F (36.7 C) 98.1 F (36.7 C) 98.1 F (36.7 C) 98.2 F (36.8 C)  TempSrc: Oral Oral  Oral  SpO2: 100% 98% 96% 92%  Weight:  146 lb 9.7 oz (66.5 kg)    Height:  5\' 2"  (1.575 m)     GEN: Well-nourished female sitting up in bed in NAD. Alert and oriented. RESP: CTAB. No wheezes, rales, or rhonchi. No increased work of breathing. CV: Normal rate and regular rhythm. No murmurs, gallops, or rubs. No LE edema. Chest binder in place SKIN: Right breast surgical site appears clean and dry. Drain in place. ABD: Soft. Non-tender. Non-distended. Normoactive bowel sounds. EXT: No edema. Warm and well perfused. NEURO: Cranial nerves II-XII grossly intact. Able to lift all four extremities against gravity. No apparent audiovisual hallucinations. Speech fluent and appropriate. PSYCH: Patient is calm and pleasant. Appropriate affect. Well-groomed; speech is appropriate and on-subject.  Labs CBC Latest Ref Rng & Units 11/06/2017 11/05/2017 10/01/2017  WBC 4.0 - 10.5 K/uL 7.1 3.1(L) -  Hemoglobin 12.0 - 15.0 g/dL 9.2(L) 11.1(L) 13.3  Hematocrit 36.0 - 46.0 % 27.3(L) 32.3(L) -  Platelets 150 - 400 K/uL 151 121(L) -   CMP Latest Ref Rng & Units 11/07/2017 11/06/2017 11/05/2017  Glucose 65 - 99 mg/dL 87 136(H) 103(H)  BUN 6 - 20 mg/dL 6 9 14   Creatinine 0.44 - 1.00 mg/dL 0.59 0.57 0.69  Sodium 135 - 145 mmol/L 138 134(L) 127(L)  Potassium 3.5 - 5.1 mmol/L 3.3(L) 4.1 3.7  Chloride 101 - 111 mmol/L 106 104 96(L)  CO2 22 - 32 mmol/L 24 24  23   Calcium 8.9 - 10.3 mg/dL 8.1(L) 7.8(L) 7.8(L)  Total Protein 6.5 - 8.1 g/dL - 5.4(L) 5.5(L)  Total Bilirubin 0.3 - 1.2 mg/dL - 0.5 0.5  Alkaline Phos 38 - 126 U/L - 83 86  AST 15 - 41 U/L - 25 23  ALT 14 - 54 U/L - 15 17   Influenza A positive MRSA nasal swab positive Wound Gram stain with rare GPC and rare GNR Wound Cx with few Staph Aureus. Sensitivity pending.  Assessment/Plan:  Ms. Asay is a 51yo female with PMH significant for breast cancer s/p bilateral mastectomy 11/14 and initiation of chemotherapy on 12/13 and anxiety who [facility] with sepsis secondary to breast tissue expander. Tissue expander removed by Plastic Surgery 12/19. On Vancomycin/Cefepime. Also Influenza A positive.  Sepsis secondary to breast tissue expander In the setting of recent bilateral mastectomy and initiation of chemotherapy last week. Ms. Hildebrandt is much improved this morning. Remains afebrile and stable vital signs. Normal WBC. On vanc/cefepime for Gram stain from wound with rare GPC and rare GNR, which we will transition to only vancomycin today. Spoke to lab this morning: Wound cultures showing few Staph Aureus, sensitivities to follow 12/22 AM. - Right breast expander removed 12/20 by Plastic Surgery - F/u wound cultures sensitivities (12/22 AM) - F/u BCx - Continue vancomycin; d/c cefepime -  Likely discharge home tomorrow after appropriate PO antibiotics have been determined  Influenza A Positive by rapid flu. Reports improvement in symptoms. - Tamiflu - Robitussin DM - Droplet precautions  Right lower back pain Reports it is improved today. Likely MSK in etiology. UCx insignificant. - Percocet 5-10mg  q4h PRN for pain  Hyponatremia, resolved Hypokalemia Likely secondary to hypovolemia. Na 127 -> 134 after 2L IV fluids -> 138 K 3.3 this morning - PO K 51mEq x1  Normocytic anemia In setting of breast cancer s/p bilateral mastectomy and current  chemotherapy, and sepsis. WBC 7.1, Hb 9.2, plt 151. Prior Hb was 13.3 in November and 11.1 yesterday. Possibly related to chemotherapy. - Continue vancomycin as above  Dispo: Anticipated discharge in approximately 1-2 day(s).   Colbert Ewing, MD 11/07/2017, 8:20 AM Pager: Mamie Nick [phone]

## 2017-11-07 NOTE — Progress Notes (Signed)
Pharmacy Antibiotic Note  Jasmine [name] is a 51 y.o. female admitted on 11/05/2017 with breast pocket wound infection s/p removal of breast tissue expander.   Vancomycin trough is 13 mcg/mL on 750 mg IV every 12 hours.  Wound culture with few staph aureus - sensitivities pending.  *Also on Tamiflu for + flu A until 12/24.   Plan: Continue Vancomycin 750 mg IV every 12 hours  Follow-up LOT -Monitor renal fx, cultures   Height: 5\' 2"  (157.5 cm) Weight: 146 lb 9.7 oz (66.5 kg)(bedscale) IBW/kg (Calculated) : 50.1  Temp (24hrs), Avg:98.1 F (36.7 C), Min:98.1 F (36.7 C), Max:98.2 F (36.8 C)  Recent Labs  Lab 11/05/17 1138 11/05/17 1156 11/05/17 1325 11/06/17 0431 11/07/17 0536 11/07/17 1302  WBC 3.1*  --   --  7.1  --   --   CREATININE 0.69  --   --  0.57 0.59  --   LATICACIDVEN  --  1.65 1.08  --   --   --   VANCOTROUGH  --   --   --   --   --  13*    Estimated Creatinine Clearance: 72.8 mL/min (by C-G formula based on SCr of 0.59 mg/dL).     Antimicrobials this admission: 12/19 cefepime > 12/21 12/19 vancomycin >  Dose adjustments this admission: 12/21 Vancomycin trough = 13 mcg/mL on 750 IV q12 for skin/soft tissue infection  Microbiology results: 12/19 blood cx: 12/19 urine cx:   Sloan Leiter, PharmD, BCPS, BCCCP Clinical Pharmacist Clinical phone 11/07/2017 until 3:30PM - [phone] After hours, please call #[phone] 11/07/2017 2:08 PM

## 2017-11-07 NOTE — Anesthesia Postprocedure Evaluation (Signed)
Anesthesia Post Note  Patient: Jasmine [name]  Procedure(s) Performed: DEBRIDEMENT OF RIGHT BREAST WITH CLOSURE WOUND (Right Breast)     Patient location during evaluation: PACU Anesthesia Type: General Level of consciousness: awake, awake and alert and oriented Pain management: pain level controlled Vital Signs Assessment: post-procedure vital signs reviewed and stable Respiratory status: spontaneous breathing, nonlabored ventilation and respiratory function stable Cardiovascular status: blood pressure returned to baseline Anesthetic complications: no    Last Vitals:  Vitals:   11/07/17 0610 11/07/17 1400  BP: (!) 110/52 (!) 105/52  Pulse: 88 (!) 105  Resp: 20 20  Temp: 36.8 C 36.9 C  SpO2: 92% 98%    Last Pain:  Vitals:   11/07/17 1559  TempSrc:   PainSc: 4                  Ventura Leggitt COKER

## 2017-11-07 NOTE — Progress Notes (Signed)
Subjective: Doing much better today.  Sitting up and talking.  Encouraged healthy eating.  She is very pleased with her improvement.  Objective: Vital signs in last 24 hours: Temp:  [98.1 F (36.7 C)-98.2 F (36.8 C)] 98.2 F (36.8 C) (12/21 0610) Pulse Rate:  [88-96] 88 (12/21 0610) Resp:  [18-22] 20 (12/21 0610) BP: (109-124)/(52-63) 110/52 (12/21 0610) SpO2:  [92 %-100 %] 92 % (12/21 0610) Weight:  [66.5 kg (146 lb 9.7 oz)] 66.5 kg (146 lb 9.7 oz) (12/20 1748) Weight change: 2.996 kg (6 lb 9.7 oz) Last BM Date: 11/05/17  Intake/Output from previous day: 12/20 0701 - 12/21 0700 In: 2515 [P.O.:790; I.V.:1725] Out: 570 [Urine:500; Drains:70] Intake/Output this shift: No intake/output data recorded.  General appearance: alert, cooperative and no distress Skin: Skin color, texture, turgor normal. No rashes or lesions Incision/Wound: decreased redness and swelling.  Incision intact and drain tube working.  Lab Results: Recent Labs    11/05/17 1138 11/06/17 0431  WBC 3.1* 7.1  HGB 11.1* 9.2*  HCT 32.3* 27.3*  PLT 121* 151   BMET Recent Labs    11/06/17 0431 11/07/17 0536  NA 134* 138  K 4.1 3.3*  CL 104 106  CO2 24 24  GLUCOSE 136* 87  BUN 9 6  CREATININE 0.57 0.59  CALCIUM 7.8* 8.1*    Studies/Results: Dg Chest 2 View  Result Date: 11/06/2017 CLINICAL DATA:  Cough and right chest pain. EXAM: CHEST  2 VIEW COMPARISON:  11/05/2017 FINDINGS: Normal heart size and mediastinal contours. Right-sided tissue expander has been removed and there is now a right-sided chest wall drain. Left-sided porta catheter. There is no edema, consolidation, effusion, or pneumothorax. Mildly low volumes in the lateral projection. IMPRESSION: No evidence of active disease. Electronically Signed   By: Monte Fantasia M.D.   On: 11/06/2017 09:08    Medications: I have reviewed the patient's current medications.  Assessment/Plan: Plan discharge when clear with medicine.  Will see in  office in one week.  Patient aware.  LOS: 2 days    Wallace Going 11/07/2017

## 2017-11-08 DIAGNOSIS — Z1629 Resistance to other single specified antibiotic: Secondary | ICD-10-CM

## 2017-11-08 DIAGNOSIS — A4102 Sepsis due to Methicillin resistant Staphylococcus aureus: Secondary | ICD-10-CM

## 2017-11-08 DIAGNOSIS — R197 Diarrhea, unspecified: Secondary | ICD-10-CM

## 2017-11-08 LAB — AEROBIC CULTURE W GRAM STAIN (SUPERFICIAL SPECIMEN): Gram Stain: NONE SEEN

## 2017-11-08 LAB — BASIC METABOLIC PANEL
Anion gap: 9 (ref 5–15)
BUN: <5 mg/dL — ABNORMAL LOW (ref 6–20)
CALCIUM: 7.5 mg/dL — AB (ref 8.9–10.3)
CO2: 22 mmol/L (ref 22–32)
CREATININE: 0.6 mg/dL (ref 0.44–1.00)
Chloride: 108 mmol/L (ref 101–111)
GFR calc Af Amer: >60 mL/min (ref >60)
GFR calc non Af Amer: >60 mL/min (ref >60)
GLUCOSE: 88 mg/dL (ref 65–99)
Potassium: 3.5 mmol/L (ref 3.5–5.1)
Sodium: 139 mmol/L (ref 135–145)

## 2017-11-08 LAB — AEROBIC CULTURE  (SUPERFICIAL SPECIMEN)

## 2017-11-08 MED ORDER — OSELTAMIVIR PHOSPHATE 75 MG PO CAPS: 75.0000 mg | ORAL_CAPSULE | Freq: Two times a day (BID) | ORAL | 0 refills | Status: DC

## 2017-11-08 MED ORDER — SULFAMETHOXAZOLE-TRIMETHOPRIM 800-160 MG PO TABS: 1.0000 | ORAL_TABLET | Freq: Two times a day (BID) | ORAL | 0 refills | Status: DC

## 2017-11-08 MED ORDER — CHLORHEXIDINE GLUCONATE CLOTH 2 % EX PADS: 6.0000 | MEDICATED_PAD | Freq: Every day | CUTANEOUS | 0 refills | Status: DC

## 2017-11-08 MED ORDER — MUPIROCIN CALCIUM 2 % EX CREA: TOPICAL_CREAM | Freq: Two times a day (BID) | CUTANEOUS | 0 refills | Status: DC

## 2017-11-08 MED ORDER — MUPIROCIN 2 % EX OINT: 1.0000 "application " | TOPICAL_OINTMENT | Freq: Two times a day (BID) | CUTANEOUS | 0 refills | Status: DC

## 2017-11-08 NOTE — Progress Notes (Signed)
Subjective:  Patient was evaluated this morning on rounds. She states that she feels fine today.  She denies pain at the wound site.  She states she is eating and drinking well.  She denies shortness of breath, nausea/vomiting, fever/chills or chest pain.  Objective:  Vital signs in last 24 hours: Vitals:   11/07/17 0610 11/07/17 1400 11/07/17 2159 11/08/17 0557  BP: (!) 110/52 (!) 105/52 (!) 115/59 (!) 104/58  Pulse: 88 (!) 105 91 83  Resp: 20 20 19 17   Temp: 98.2 F (36.8 C) 98.4 F (36.9 C) 98.4 F (36.9 C) 99.1 F (37.3 C)  TempSrc: Oral Oral  Oral  SpO2: 92% 98% 91% 94%  Weight:      Height:       GEN: Well-nourished female sitting up in bed in NAD. Alert and oriented. RESP: CTAB. No wheezes, rales, or rhonchi. No increased work of breathing. CV: Normal rate and regular rhythm. No murmurs, gallops, or rubs. No LE edema. Chest binder in place SKIN: Right breast surgical site appears clean and dry. No tenderness. Drain in place. ABD: Soft. Non-tender. Non-distended. Normoactive bowel sounds. EXT: No edema. Warm and well perfused. PSYCH: Patient is calm and pleasant. Appropriate affect. Well-groomed; speech is appropriate and on-subject.  Labs CBC Latest Ref Rng & Units 11/06/2017 11/05/2017 10/01/2017  WBC 4.0 - 10.5 K/uL 7.1 3.1(L) -  Hemoglobin 12.0 - 15.0 g/dL 9.2(L) 11.1(L) 13.3  Hematocrit 36.0 - 46.0 % 27.3(L) 32.3(L) -  Platelets 150 - 400 K/uL 151 121(L) -   CMP Latest Ref Rng & Units 11/07/2017 11/06/2017 11/05/2017  Glucose 65 - 99 mg/dL 87 136(H) 103(H)  BUN 6 - 20 mg/dL 6 9 14   Creatinine 0.44 - 1.00 mg/dL 0.59 0.57 0.69  Sodium 135 - 145 mmol/L 138 134(L) 127(L)  Potassium 3.5 - 5.1 mmol/L 3.3(L) 4.1 3.7  Chloride 101 - 111 mmol/L 106 104 96(L)  CO2 22 - 32 mmol/L 24 24 23   Calcium 8.9 - 10.3 mg/dL 8.1(L) 7.8(L) 7.8(L)  Total Protein 6.5 - 8.1 g/dL - 5.4(L) 5.5(L)  Total Bilirubin 0.3 - 1.2 mg/dL - 0.5 0.5  Alkaline Phos 38 - 126 U/L - 83 86  AST 15  - 41 U/L - 25 23  ALT 14 - 54 U/L - 15 17   Influenza A positive MRSA nasal swab positive Wound Gram stain with rare GPC and rare GNR Wound Cx with few Staph Aureus. Sensitivity pending.  Assessment/Plan:  Jasmine [name] is a 51yo female with PMH significant for breast cancer s/p bilateral mastectomy 11/14 and initiation of chemotherapy on 12/13 and anxiety who [facility] with sepsis secondary to breast tissue expander. Tissue expander removed by Plastic Surgery 12/19. On Vancomycin/Cefepime. Also Influenza A positive.  Sepsis secondary to breast tissue expander In the setting of recent bilateral mastectomy and initiation of chemotherapy last week. Ms. Fiebelkorn continues to improve. Remains afebrile and stable vital signs. Normal WBC. On vanc for wound culture showing few staphylococcus aureus, sensitivities to follow. - Right breast expander removed 12/20 by Plastic Surgery - F/u wound cultures sensitivities  - F/u BCx - Continue vancomycin  - Likely discharge home today after appropriate PO antibiotics have been determined   Influenza A Positive by rapid flu. Reports improvement in symptoms. - Tamiflu end date 12/24 - Robitussin DM - Droplet precautions  Right lower back pain Improved throughout admission - Percocet 5-10mg  q4h PRN for pain  Hypokalemia Yesterday 3.3 with one dose of  kdur 40 po.  Labs pending this morning -repleat K as necessary  Normocytic anemia In setting of breast cancer s/p bilateral mastectomy and current chemotherapy, and sepsis. WBC 7.1, Hb 9.2, plt 151. Prior Hb was 13.3 in November and 11.1 yesterday. Possibly related to chemotherapy. - Continue vancomycin as above  Dispo: Anticipated discharge in approximately 0-1 days.   Kalman Shan [place], DO 11/08/2017, 10:48 AM Pager: Mamie Nick [phone]

## 2017-11-08 NOTE — Progress Notes (Signed)
Patient reviewed discharge instructions and verbalized understanding. Patient made aware of medication changes and follow up appointments. Patient left unit in stable condition via wheelchair.

## 2017-11-10 LAB — CULTURE, BLOOD (ROUTINE X 2)
CULTURE: NO GROWTH
Culture: NO GROWTH
Special Requests: ADEQUATE
Special Requests: ADEQUATE

## 2017-11-13 LAB — ANAEROBIC CULTURE: CULTURE: NEGATIVE

## 2017-11-27 DIAGNOSIS — Z170 Estrogen receptor positive status [ER+]: Secondary | ICD-10-CM | POA: Diagnosis not present

## 2017-11-27 DIAGNOSIS — Z9011 Acquired absence of right breast and nipple: Secondary | ICD-10-CM | POA: Diagnosis not present

## 2017-11-27 DIAGNOSIS — C50511 Malignant neoplasm of lower-outer quadrant of right female breast: Secondary | ICD-10-CM | POA: Diagnosis not present

## 2017-12-02 ENCOUNTER — Encounter (HOSPITAL_BASED_OUTPATIENT_CLINIC_OR_DEPARTMENT_OTHER): Payer: Self-pay | Admitting: *Deleted

## 2017-12-09 ENCOUNTER — Ambulatory Visit: Payer: Self-pay | Admitting: Plastic Surgery

## 2017-12-09 DIAGNOSIS — S21001A Unspecified open wound of right breast, initial encounter: Secondary | ICD-10-CM

## 2017-12-10 ENCOUNTER — Encounter (HOSPITAL_BASED_OUTPATIENT_CLINIC_OR_DEPARTMENT_OTHER): Payer: Self-pay | Admitting: Anesthesiology

## 2017-12-10 ENCOUNTER — Encounter (HOSPITAL_BASED_OUTPATIENT_CLINIC_OR_DEPARTMENT_OTHER)
Admission: RE | Disposition: A | Discharge status: Home / Self Care | Payer: Self-pay | Source: Ambulatory Visit | Attending: Plastic Surgery

## 2017-12-10 ENCOUNTER — Ambulatory Visit (HOSPITAL_BASED_OUTPATIENT_CLINIC_OR_DEPARTMENT_OTHER)
Admission: RE | Admit: 2017-12-10 | Discharge: 2017-12-10 | Disposition: A | Discharge status: Home / Self Care | Payer: Commercial Managed Care - PPO | Source: Ambulatory Visit | Attending: Plastic Surgery | Admitting: Plastic Surgery

## 2017-12-10 SURGERY — MINOR INCISION AND DRAINAGE OF ABSCESS
Anesthesia: Choice | Laterality: Right

## 2017-12-10 NOTE — Anesthesia Preprocedure Evaluation (Deleted)
Anesthesia Evaluation    Reviewed: Allergy & Precautions, Patient's Chart, lab work & pertinent test results  Airway Mallampati: II  TM Distance: >3 FB     Dental  (+) Edentulous Upper, Dental Advisory Given   Pulmonary Current Smoker,   Decreased breath sounds and rales at right lung base. Splinting on deep inspiration.  - rhonchi + decreased breath sounds      Cardiovascular negative cardio ROS Normal cardiovascular exam Rhythm:Regular Rate:Normal     Neuro/Psych PSYCHIATRIC DISORDERS Anxiety negative neurological ROS     GI/Hepatic negative GI ROS, Neg liver ROS,   Endo/Other  negative endocrine ROS  Renal/GU negative Renal ROS     Musculoskeletal   Abdominal   Peds  Hematology negative hematology ROS (+)   Anesthesia Other Findings   Reproductive/Obstetrics                             Anesthesia Physical  Anesthesia Plan  ASA: II  Anesthesia Plan: General   Post-op Pain Management:    Induction: Intravenous  PONV Risk Score and Plan: 3 and Ondansetron, Dexamethasone, Scopolamine patch - Pre-op and Treatment may vary due to age or medical condition  Airway Management Planned: LMA  Additional Equipment:   Intra-op Plan:   Post-operative Plan: Extubation in OR  Informed Consent:   Plan Discussed with:   Anesthesia Plan Comments:         Anesthesia Quick Evaluation

## 2017-12-10 NOTE — H&P (Signed)
Jasmine [name] is an 52 y.o. female.   Chief Complaint: Breast wound HPI: The patient is a 52 yrs old wf here for treatment of her right breast wound.  She underwent breast reconstruction after mastectomies.  She developed pneumonia and the flu.  This created a difficult situation of her right breast which developed a seroma and concerns for infection.  The expander was removed.  She recovered from the flu.  She now presents for care of the right breast incision.  Past Medical History:  Diagnosis Date  . Anxiety     Past Surgical History:  Procedure Laterality Date  . ABDOMINAL HYSTERECTOMY  2008  . BREAST RECONSTRUCTION WITH PLACEMENT OF TISSUE EXPANDER AND FLEX HD (ACELLULAR HYDRATED DERMIS) Bilateral 10/01/2017   Procedure: BILATERAL BREAST RECONSTRUCTION WITH PLACEMENT OF TISSUE EXPANDER AND FLEX HD (ACELLULAR HYDRATED DERMIS);  Surgeon: Wallace Going, DO;  Location: [facility];  Service: Plastics;  Laterality: Bilateral;  . CHOLECYSTECTOMY  2008  . DEBRIDEMENT AND CLOSURE WOUND Right 11/05/2017   Procedure: DEBRIDEMENT OF RIGHT BREAST WITH CLOSURE WOUND;  Surgeon: Wallace Going, DO;  Location: [place];  Service: Plastics;  Laterality: Right;  . MASTECTOMY W/ SENTINEL NODE BIOPSY Right 10/01/2017   Procedure: RIGHT  MASTECTOMY WITH RIGHT SENTINEL LYMPH NODE BIOPSY;  Surgeon: Coralie Keens, MD;  Location: [place];  Service: General;  Laterality: Right;  . SIMPLE MASTECTOMY WITH AXILLARY SENTINEL NODE BIOPSY Left 10/01/2017   Procedure: Left Mastectomy;  Surgeon: Coralie Keens, MD;  Location: [facility];  Service: General;  Laterality: Left;  . WISDOM TOOTH EXTRACTION  1984    Family History  Problem Relation Age of Onset  . Breast cancer Paternal Grandmother    Social History:  reports that she has been smoking.  She has been smoking about 1.00 pack per day. she has never used smokeless tobacco. She reports that she  does not drink alcohol or use drugs.  Allergies:  Allergies  Allergen Reactions  . Bee Venom Anaphylaxis    Swells internally    Medications Prior to Admission  Medication Sig Dispense Refill  . ALPRAZolam (XANAX) 0.5 MG tablet Take 0.5 mg by mouth at bedtime.     Marland Kitchen amphetamine-dextroamphetamine (ADDERALL XR) 30 MG 24 hr capsule Take 30 mg by mouth daily.  0  . HYDROcodone-acetaminophen (NORCO/VICODIN) 5-325 MG tablet Take 1 tablet every 6 (six) hours as needed by mouth for moderate pain.    Marland Kitchen ibuprofen (ADVIL,MOTRIN) 200 MG tablet Take 200-400 mg by mouth every 6 (six) hours as needed (for pain or headaches).       No results found for this or any previous visit (from the past 48 hour(s)). No results found.  Review of Systems  Constitutional: Negative.   HENT: Negative.   Eyes: Negative.   Respiratory: Negative.   Cardiovascular: Negative.   Gastrointestinal: Negative.   Genitourinary: Negative.   Musculoskeletal: Negative.   Skin: Negative.   Neurological: Negative.   Psychiatric/Behavioral: Negative.     Height 5\' 2"  (1.575 m), weight 66.2 kg (146 lb). Physical Exam  Constitutional: She is oriented to person, place, and time. She appears well-developed and well-nourished.  HENT:  Head: Normocephalic and atraumatic.  Eyes: Conjunctivae and EOM are normal. Pupils are equal, round, and reactive to light.  Respiratory: Effort normal.  GI: Soft.  Neurological: She is alert and oriented to person, place, and time.  Skin: Skin is warm.  Psychiatric: She has a normal  mood and affect. Her behavior is normal. Judgment and thought content normal.     Assessment/Plan Plan for excision of right breast wound and closure.  [place], DO 12/10/2017, 7:19 AM

## 2017-12-18 DIAGNOSIS — T8131XA Disruption of external operation (surgical) wound, not elsewhere classified, initial encounter: Secondary | ICD-10-CM | POA: Diagnosis not present

## 2017-12-18 DIAGNOSIS — Z170 Estrogen receptor positive status [ER+]: Secondary | ICD-10-CM | POA: Diagnosis not present

## 2017-12-18 DIAGNOSIS — C50911 Malignant neoplasm of unspecified site of right female breast: Secondary | ICD-10-CM | POA: Diagnosis not present

## 2017-12-18 DIAGNOSIS — Z9011 Acquired absence of right breast and nipple: Secondary | ICD-10-CM | POA: Diagnosis not present

## 2018-01-07 ENCOUNTER — Ambulatory Visit: Payer: Self-pay | Admitting: Plastic Surgery

## 2018-01-07 NOTE — H&P (Signed)
Jasmine [name] is an 52 y.o. female.   Chief Complaint: right breast wound HPI: The patient is a 52 y.o. yrs old wf here for follow up on her right breast wound.  She was scheduled for excision of the wound of the right breast but left the OR.  She was very stressed and had a death in the family (sister) which has made things very difficult.  She seems to be doing better today.  She was tearful but able to talk about it.  The right breast is open 3 x 3 cm and likely has a capsule formation that needs to be excised.  There is no drainage on the dressing at this time.  She has not been having fevers.  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 She underwent a lap choley in the past and is a smoker.  The patient is 5 feet 2 inches tall, weight 137 pounds and preop bra = 36 D.  Past Medical History:  Diagnosis Date  . Anxiety     Past Surgical History:  Procedure Laterality Date  . ABDOMINAL HYSTERECTOMY  2008  . BREAST RECONSTRUCTION WITH PLACEMENT OF TISSUE EXPANDER AND FLEX HD (ACELLULAR HYDRATED DERMIS) Bilateral 10/01/2017   Procedure: BILATERAL BREAST RECONSTRUCTION WITH PLACEMENT OF TISSUE EXPANDER AND FLEX HD (ACELLULAR HYDRATED DERMIS);  Surgeon: Wallace Going, DO;  Location: [place];  Service: Plastics;  Laterality: Bilateral;  . CHOLECYSTECTOMY  2008  . DEBRIDEMENT AND CLOSURE WOUND Right 11/05/2017   Procedure: DEBRIDEMENT OF RIGHT BREAST WITH CLOSURE WOUND;  Surgeon: Wallace Going, DO;  Location: [place];  Service: Plastics;  Laterality: Right;  . MASTECTOMY W/ SENTINEL NODE BIOPSY Right 10/01/2017   Procedure: RIGHT  MASTECTOMY WITH RIGHT SENTINEL LYMPH NODE BIOPSY;  Surgeon: Coralie Keens, MD;  Location: [place];  Service: General;  Laterality: Right;  . SIMPLE MASTECTOMY WITH AXILLARY SENTINEL NODE BIOPSY Left  10/01/2017   Procedure: Left Mastectomy;  Surgeon: Coralie Keens, MD;  Location: [place];  Service: General;  Laterality: Left;  . WISDOM TOOTH EXTRACTION  1984    Family History  Problem Relation Age of Onset  . Breast cancer Paternal Grandmother    Social History:  reports that she has been smoking.  She has been smoking about 1.00 pack per day. she has never used smokeless tobacco. She reports that she does not drink alcohol or use drugs.  Allergies:  Allergies  Allergen Reactions  . Bee Venom Anaphylaxis    Swells internally    No medications prior to admission.    No results found for this or any previous visit (from the past 48 hour(s)). No results found.  Review of Systems  Constitutional: Negative.   HENT: Negative.   Respiratory: Negative.   Cardiovascular: Negative.   Gastrointestinal: Negative.   Genitourinary: Negative.   Musculoskeletal: Negative.   Skin: Negative.   Neurological: Negative.   Psychiatric/Behavioral: Negative.     There were no vitals taken for this visit. Physical Exam  Constitutional: She is oriented to person, place, and time. She appears well-developed and well-nourished.  HENT:  Head: Normocephalic and atraumatic.  Eyes: EOM are normal. Pupils are equal, round, and reactive to light.  Cardiovascular: Normal rate.  Respiratory: Effort normal.    GI: Soft. She exhibits no distension.  Neurological: She is alert and oriented to  person, place, and time.  Skin: Skin is warm.  Psychiatric: She has a normal mood and affect. Her behavior is normal. Thought content normal.     Assessment/Plan Plan for excision of right breast wound with possible VAC placement  [place], DO 01/07/2018, 8:21 AM

## 2018-01-08 DIAGNOSIS — Z170 Estrogen receptor positive status [ER+]: Secondary | ICD-10-CM | POA: Diagnosis not present

## 2018-01-08 DIAGNOSIS — C50911 Malignant neoplasm of unspecified site of right female breast: Secondary | ICD-10-CM

## 2018-01-08 DIAGNOSIS — Z9011 Acquired absence of right breast and nipple: Secondary | ICD-10-CM | POA: Diagnosis not present

## 2018-01-09 ENCOUNTER — Other Ambulatory Visit: Payer: Self-pay

## 2018-01-09 ENCOUNTER — Encounter (HOSPITAL_BASED_OUTPATIENT_CLINIC_OR_DEPARTMENT_OTHER): Payer: Self-pay | Admitting: *Deleted

## 2018-01-12 ENCOUNTER — Ambulatory Visit (HOSPITAL_BASED_OUTPATIENT_CLINIC_OR_DEPARTMENT_OTHER): Payer: Commercial Managed Care - PPO | Admitting: Anesthesiology

## 2018-01-12 ENCOUNTER — Encounter (HOSPITAL_BASED_OUTPATIENT_CLINIC_OR_DEPARTMENT_OTHER): Payer: Self-pay | Admitting: Anesthesiology

## 2018-01-12 ENCOUNTER — Ambulatory Visit (HOSPITAL_BASED_OUTPATIENT_CLINIC_OR_DEPARTMENT_OTHER)
Admission: RE | Admit: 2018-01-12 | Discharge: 2018-01-12 | Disposition: A | Discharge status: Home / Self Care | Payer: Commercial Managed Care - PPO | Source: Ambulatory Visit | Attending: Plastic Surgery | Admitting: Plastic Surgery

## 2018-01-12 ENCOUNTER — Encounter (HOSPITAL_BASED_OUTPATIENT_CLINIC_OR_DEPARTMENT_OTHER)
Admission: RE | Disposition: A | Discharge status: Home / Self Care | Payer: Self-pay | Source: Ambulatory Visit | Attending: Plastic Surgery

## 2018-01-12 DIAGNOSIS — Z91030 Bee allergy status: Secondary | ICD-10-CM | POA: Insufficient documentation

## 2018-01-12 DIAGNOSIS — Z79899 Other long term (current) drug therapy: Secondary | ICD-10-CM | POA: Insufficient documentation

## 2018-01-12 DIAGNOSIS — C50911 Malignant neoplasm of unspecified site of right female breast: Secondary | ICD-10-CM | POA: Diagnosis not present

## 2018-01-12 DIAGNOSIS — T8189XA Other complications of procedures, not elsewhere classified, initial encounter: Secondary | ICD-10-CM | POA: Diagnosis not present

## 2018-01-12 DIAGNOSIS — Z9011 Acquired absence of right breast and nipple: Secondary | ICD-10-CM | POA: Insufficient documentation

## 2018-01-12 DIAGNOSIS — Z170 Estrogen receptor positive status [ER+]: Secondary | ICD-10-CM | POA: Diagnosis not present

## 2018-01-12 DIAGNOSIS — F419 Anxiety disorder, unspecified: Secondary | ICD-10-CM | POA: Diagnosis not present

## 2018-01-12 DIAGNOSIS — S21001A Unspecified open wound of right breast, initial encounter: Secondary | ICD-10-CM

## 2018-01-12 DIAGNOSIS — F17210 Nicotine dependence, cigarettes, uncomplicated: Secondary | ICD-10-CM | POA: Diagnosis not present

## 2018-01-12 HISTORY — DX: Malignant (primary) neoplasm, unspecified: C80.1

## 2018-01-12 HISTORY — DX: Major depressive disorder, single episode, unspecified: F32.9

## 2018-01-12 HISTORY — DX: Depression, unspecified: F32.A

## 2018-01-12 HISTORY — PX: DEBRIDEMENT AND CLOSURE WOUND: SHX5614

## 2018-01-12 SURGERY — DEBRIDEMENT, WOUND, WITH CLOSURE
Anesthesia: General | Site: Breast | Laterality: Right

## 2018-01-12 MED ORDER — OXYCODONE HCL 5 MG PO TABS: 5.0000 mg | ORAL_TABLET | ORAL | Status: DC | PRN

## 2018-01-12 MED ORDER — ONDANSETRON HCL 4 MG/2ML IJ SOLN
INTRAMUSCULAR | Status: DC | PRN
  Administered 2018-01-12: 4 mg via INTRAVENOUS

## 2018-01-12 MED ORDER — BUPIVACAINE-EPINEPHRINE (PF) 0.5% -1:200000 IJ SOLN
INTRAMUSCULAR | Status: AC
  Filled 2018-01-12: qty 30

## 2018-01-12 MED ORDER — LIDOCAINE-EPINEPHRINE 1 %-1:100000 IJ SOLN
INTRAMUSCULAR | Status: AC
  Filled 2018-01-12: qty 1

## 2018-01-12 MED ORDER — KETOROLAC TROMETHAMINE 30 MG/ML IJ SOLN: 30.0000 mg | Freq: Once | INTRAMUSCULAR | Status: DC | PRN

## 2018-01-12 MED ORDER — CEFAZOLIN SODIUM-DEXTROSE 2-4 GM/100ML-% IV SOLN
2.0000 g | INTRAVENOUS | Status: AC
  Administered 2018-01-12: 2 g via INTRAVENOUS

## 2018-01-12 MED ORDER — LACTATED RINGERS IV SOLN
INTRAVENOUS | Status: DC
  Administered 2018-01-12: 07:00:00 via INTRAVENOUS

## 2018-01-12 MED ORDER — FENTANYL CITRATE (PF) 100 MCG/2ML IJ SOLN
INTRAMUSCULAR | Status: AC
  Filled 2018-01-12: qty 2

## 2018-01-12 MED ORDER — SODIUM CHLORIDE 0.9% FLUSH: 3.0000 mL | Freq: Two times a day (BID) | INTRAVENOUS | Status: DC

## 2018-01-12 MED ORDER — METRONIDAZOLE IN NACL 5-0.79 MG/ML-% IV SOLN
INTRAVENOUS | Status: DC | PRN
  Administered 2018-01-12: 500 mg via INTRAVENOUS

## 2018-01-12 MED ORDER — BUPIVACAINE HCL (PF) 0.25 % IJ SOLN
INTRAMUSCULAR | Status: AC
Start: 2018-01-12 — End: ?
  Filled 2018-01-12: qty 30

## 2018-01-12 MED ORDER — FENTANYL CITRATE (PF) 100 MCG/2ML IJ SOLN
25.0000 ug | INTRAMUSCULAR | Status: DC | PRN
  Administered 2018-01-12 (×3): 25 ug via INTRAVENOUS

## 2018-01-12 MED ORDER — MUPIROCIN 2 % EX OINT
TOPICAL_OINTMENT | CUTANEOUS | Status: AC
  Filled 2018-01-12: qty 22

## 2018-01-12 MED ORDER — MIDAZOLAM HCL 2 MG/2ML IJ SOLN
INTRAMUSCULAR | Status: AC
  Filled 2018-01-12: qty 2

## 2018-01-12 MED ORDER — VITAMIN A 10000 UNITS PO CAPS: 10000.0000 [IU] | ORAL_CAPSULE | Freq: Every day | ORAL | 1 refills | Status: AC

## 2018-01-12 MED ORDER — DEXAMETHASONE SODIUM PHOSPHATE 10 MG/ML IJ SOLN
INTRAMUSCULAR | Status: AC
  Filled 2018-01-12: qty 1

## 2018-01-12 MED ORDER — OXYCODONE HCL 5 MG PO TABS: 5.0000 mg | ORAL_TABLET | Freq: Once | ORAL | Status: DC | PRN

## 2018-01-12 MED ORDER — SODIUM CHLORIDE 0.9 % IV SOLN
INTRAVENOUS | Status: DC | PRN
  Administered 2018-01-12: 500 mL

## 2018-01-12 MED ORDER — FENTANYL CITRATE (PF) 100 MCG/2ML IJ SOLN: 50.0000 ug | INTRAMUSCULAR | Status: DC | PRN

## 2018-01-12 MED ORDER — PROMETHAZINE HCL 25 MG/ML IJ SOLN: 6.2500 mg | INTRAMUSCULAR | Status: DC | PRN

## 2018-01-12 MED ORDER — SCOPOLAMINE 1 MG/3DAYS TD PT72: 1.0000 | MEDICATED_PATCH | Freq: Once | TRANSDERMAL | Status: DC | PRN

## 2018-01-12 MED ORDER — MUPIROCIN CALCIUM 2 % EX CREA
TOPICAL_CREAM | CUTANEOUS | Status: AC
  Filled 2018-01-12: qty 15

## 2018-01-12 MED ORDER — CEPHALEXIN 500 MG PO CAPS: 500.0000 mg | ORAL_CAPSULE | Freq: Four times a day (QID) | ORAL | 0 refills | Status: AC

## 2018-01-12 MED ORDER — MUPIROCIN 2 % EX OINT
TOPICAL_OINTMENT | CUTANEOUS | Status: DC | PRN
  Administered 2018-01-12: 1 via TOPICAL

## 2018-01-12 MED ORDER — LIDOCAINE 2% (20 MG/ML) 5 ML SYRINGE
INTRAMUSCULAR | Status: AC
  Filled 2018-01-12: qty 5

## 2018-01-12 MED ORDER — SODIUM CHLORIDE 0.9 % IV SOLN: 250.0000 mL | INTRAVENOUS | Status: DC | PRN

## 2018-01-12 MED ORDER — SODIUM CHLORIDE 0.9% FLUSH: 3.0000 mL | INTRAVENOUS | Status: DC | PRN

## 2018-01-12 MED ORDER — MIDAZOLAM HCL 2 MG/2ML IJ SOLN: 1.0000 mg | INTRAMUSCULAR | Status: DC | PRN

## 2018-01-12 MED ORDER — PROPOFOL 10 MG/ML IV BOLUS
INTRAVENOUS | Status: DC | PRN
  Administered 2018-01-12: 150 mg via INTRAVENOUS

## 2018-01-12 MED ORDER — PROPOFOL 10 MG/ML IV BOLUS
INTRAVENOUS | Status: AC
Start: 2018-01-12 — End: ?
  Filled 2018-01-12: qty 40

## 2018-01-12 MED ORDER — OXYCODONE HCL 5 MG/5ML PO SOLN: 5.0000 mg | Freq: Once | ORAL | Status: DC | PRN

## 2018-01-12 MED ORDER — ONDANSETRON HCL 4 MG/2ML IJ SOLN
INTRAMUSCULAR | Status: AC
  Filled 2018-01-12: qty 2

## 2018-01-12 MED ORDER — BACITRACIN ZINC 500 UNIT/GM EX OINT
TOPICAL_OINTMENT | CUTANEOUS | Status: AC
  Filled 2018-01-12: qty 28.35

## 2018-01-12 MED ORDER — SCOPOLAMINE 1 MG/3DAYS TD PT72: 1.0000 | MEDICATED_PATCH | TRANSDERMAL | Status: DC

## 2018-01-12 MED ORDER — DEXAMETHASONE SODIUM PHOSPHATE 4 MG/ML IJ SOLN
INTRAMUSCULAR | Status: DC | PRN
  Administered 2018-01-12: 10 mg via INTRAVENOUS

## 2018-01-12 MED ORDER — CEFAZOLIN SODIUM-DEXTROSE 2-4 GM/100ML-% IV SOLN
INTRAVENOUS | Status: AC
  Filled 2018-01-12: qty 100

## 2018-01-12 MED ORDER — MIDAZOLAM HCL 5 MG/5ML IJ SOLN
INTRAMUSCULAR | Status: DC | PRN
  Administered 2018-01-12 (×2): 1 mg via INTRAVENOUS

## 2018-01-12 MED ORDER — FENTANYL CITRATE (PF) 100 MCG/2ML IJ SOLN
INTRAMUSCULAR | Status: DC | PRN
  Administered 2018-01-12 (×6): 25 ug via INTRAVENOUS
  Administered 2018-01-12: 50 ug via INTRAVENOUS

## 2018-01-12 MED ORDER — KETOROLAC TROMETHAMINE 30 MG/ML IJ SOLN
INTRAMUSCULAR | Status: AC
  Filled 2018-01-12: qty 1

## 2018-01-12 MED ORDER — LIDOCAINE-EPINEPHRINE 1 %-1:100000 IJ SOLN
INTRAMUSCULAR | Status: DC | PRN
  Administered 2018-01-12: 7 mL

## 2018-01-12 MED ORDER — LIDOCAINE 2% (20 MG/ML) 5 ML SYRINGE
INTRAMUSCULAR | Status: DC | PRN
  Administered 2018-01-12: 80 mg via INTRAVENOUS

## 2018-01-12 MED ORDER — METRONIDAZOLE IN NACL 5-0.79 MG/ML-% IV SOLN
INTRAVENOUS | Status: AC
  Filled 2018-01-12: qty 100

## 2018-01-12 SURGICAL SUPPLY — 65 items
BINDER BREAST 3XL (GAUZE/BANDAGES/DRESSINGS) IMPLANT
BINDER BREAST LRG (GAUZE/BANDAGES/DRESSINGS) IMPLANT
BINDER BREAST MEDIUM (GAUZE/BANDAGES/DRESSINGS) ×3 IMPLANT
BINDER BREAST XLRG (GAUZE/BANDAGES/DRESSINGS) IMPLANT
BINDER BREAST XXLRG (GAUZE/BANDAGES/DRESSINGS) IMPLANT
BLADE CLIPPER SURG (BLADE) IMPLANT
BLADE HEX COATED 2.75 (ELECTRODE) ×3 IMPLANT
BLADE SURG 10 STRL SS (BLADE) IMPLANT
BLADE SURG 15 STRL LF DISP TIS (BLADE) ×1 IMPLANT
BLADE SURG 15 STRL SS (BLADE) ×2
BNDG GAUZE ELAST 4 BULKY (GAUZE/BANDAGES/DRESSINGS) IMPLANT
CHLORAPREP W/TINT 26ML (MISCELLANEOUS) IMPLANT
COVER BACK TABLE 60X90IN (DRAPES) ×3 IMPLANT
COVER MAYO STAND STRL (DRAPES) ×3 IMPLANT
DECANTER SPIKE VIAL GLASS SM (MISCELLANEOUS) ×3 IMPLANT
DERMABOND ADVANCED (GAUZE/BANDAGES/DRESSINGS)
DERMABOND ADVANCED .7 DNX12 (GAUZE/BANDAGES/DRESSINGS) IMPLANT
DRAIN CHANNEL 15F RND FF W/TCR (WOUND CARE) ×3 IMPLANT
DRAIN CHANNEL 19F RND (DRAIN) IMPLANT
DRAPE INCISE IOBAN 66X45 STRL (DRAPES) IMPLANT
DRAPE LAPAROSCOPIC ABDOMINAL (DRAPES) ×3 IMPLANT
DRAPE LAPAROTOMY 100X72 PEDS (DRAPES) IMPLANT
DRAPE SURG 17X23 STRL (DRAPES) IMPLANT
DRAPE U-SHAPE 76X120 STRL (DRAPES) IMPLANT
DRESSING DUODERM 4X4 STERILE (GAUZE/BANDAGES/DRESSINGS) IMPLANT
DRSG ADAPTIC 3X8 NADH LF (GAUZE/BANDAGES/DRESSINGS) IMPLANT
DRSG EMULSION OIL 3X3 NADH (GAUZE/BANDAGES/DRESSINGS) IMPLANT
DRSG PAD ABDOMINAL 8X10 ST (GAUZE/BANDAGES/DRESSINGS) ×3 IMPLANT
DRSG TEGADERM 4X10 (GAUZE/BANDAGES/DRESSINGS) IMPLANT
DRSG TELFA 3X8 NADH (GAUZE/BANDAGES/DRESSINGS) IMPLANT
ELECT REM PT RETURN 9FT ADLT (ELECTROSURGICAL) ×3
ELECTRODE REM PT RTRN 9FT ADLT (ELECTROSURGICAL) ×1 IMPLANT
EVACUATOR SILICONE 100CC (DRAIN) ×3 IMPLANT
GAUZE SPONGE 4X4 12PLY STRL (GAUZE/BANDAGES/DRESSINGS) ×3 IMPLANT
GAUZE XEROFORM 5X9 LF (GAUZE/BANDAGES/DRESSINGS) IMPLANT
GLOVE BIO SURGEON STRL SZ 6.5 (GLOVE) ×4 IMPLANT
GLOVE BIO SURGEONS STRL SZ 6.5 (GLOVE) ×2
GLOVE SURG SS PI 7.0 STRL IVOR (GLOVE) ×3 IMPLANT
GOWN STRL REUS W/ TWL LRG LVL3 (GOWN DISPOSABLE) ×2 IMPLANT
GOWN STRL REUS W/ TWL XL LVL3 (GOWN DISPOSABLE) ×1 IMPLANT
GOWN STRL REUS W/TWL LRG LVL3 (GOWN DISPOSABLE) ×4
GOWN STRL REUS W/TWL XL LVL3 (GOWN DISPOSABLE) ×2
NEEDLE HYPO 25X1 1.5 SAFETY (NEEDLE) ×3 IMPLANT
NS IRRIG 1000ML POUR BTL (IV SOLUTION) ×3 IMPLANT
PACK BASIN DAY SURGERY FS (CUSTOM PROCEDURE TRAY) ×3 IMPLANT
PENCIL BUTTON HOLSTER BLD 10FT (ELECTRODE) ×3 IMPLANT
SHEET MEDIUM DRAPE 40X70 STRL (DRAPES) IMPLANT
SLEEVE SCD COMPRESS KNEE MED (MISCELLANEOUS) ×3 IMPLANT
SPONGE LAP 18X18 X RAY DECT (DISPOSABLE) ×3 IMPLANT
SPONGE LAP 4X18 X RAY DECT (DISPOSABLE) ×6 IMPLANT
STAPLER VISISTAT 35W (STAPLE) IMPLANT
SUT MNCRL AB 3-0 PS2 18 (SUTURE) ×3 IMPLANT
SUT MNCRL AB 4-0 PS2 18 (SUTURE) ×3 IMPLANT
SUT MON AB 5-0 PS2 18 (SUTURE) ×3 IMPLANT
SUT SILK 3 0 PS 1 (SUTURE) ×3 IMPLANT
SWAB COLLECTION DEVICE MRSA (MISCELLANEOUS) IMPLANT
SWAB CULTURE ESWAB REG 1ML (MISCELLANEOUS) IMPLANT
SYR BULB IRRIGATION 50ML (SYRINGE) IMPLANT
SYR CONTROL 10ML LL (SYRINGE) ×3 IMPLANT
TOWEL OR 17X24 6PK STRL BLUE (TOWEL DISPOSABLE) ×6 IMPLANT
TRAY DSU PREP LF (CUSTOM PROCEDURE TRAY) ×3 IMPLANT
TUBE CONNECTING 20'X1/4 (TUBING) ×1
TUBE CONNECTING 20X1/4 (TUBING) ×2 IMPLANT
UNDERPAD 30X30 (UNDERPADS AND DIAPERS) ×3 IMPLANT
YANKAUER SUCT BULB TIP NO VENT (SUCTIONS) ×3 IMPLANT

## 2018-01-12 NOTE — Interval H&P Note (Signed)
History and Physical Interval Note:  01/12/2018 7:02 AM  Jasmine [name]  has presented today for surgery, with the diagnosis of RIGHT OPEN BREAST WOUND  The various methods of treatment have been discussed with the patient and family. After consideration of risks, benefits and other options for treatment, the patient has consented to  Procedure(s): EXCISION OF RIGHT OPEN BREAST WOUND (Right) as a surgical intervention .  The patient's history has been reviewed, patient examined, no change in status, stable for surgery.  I have reviewed the patient's chart and labs.  Questions were answered to the patient's satisfaction.     Loel Lofty Dillingham

## 2018-01-12 NOTE — Anesthesia Preprocedure Evaluation (Signed)
Anesthesia Evaluation  Patient identified by MRN, date of birth, ID band Patient awake    Reviewed: Allergy & Precautions, NPO status , Patient's Chart, lab work & pertinent test results  Airway Mallampati: II  TM Distance: >3 FB Neck ROM: Full    Dental no notable dental hx.    Pulmonary Current Smoker,    Pulmonary exam normal breath sounds clear to auscultation       Cardiovascular negative cardio ROS Normal cardiovascular exam Rhythm:Regular Rate:Normal     Neuro/Psych Anxiety negative neurological ROS     GI/Hepatic negative GI ROS, Neg liver ROS,   Endo/Other  negative endocrine ROS  Renal/GU negative Renal ROS  negative genitourinary   Musculoskeletal negative musculoskeletal ROS (+)   Abdominal   Peds negative pediatric ROS (+)  Hematology negative hematology ROS (+)   Anesthesia Other Findings   Reproductive/Obstetrics negative OB ROS                             Anesthesia Physical Anesthesia Plan  ASA: II  Anesthesia Plan: General   Post-op Pain Management:    Induction: Intravenous  PONV Risk Score and Plan: 2 and Ondansetron and Dexamethasone  Airway Management Planned: LMA  Additional Equipment:   Intra-op Plan:   Post-operative Plan:   Informed Consent: I have reviewed the patients History and Physical, chart, labs and discussed the procedure including the risks, benefits and alternatives for the proposed anesthesia with the patient or authorized representative who has indicated his/her understanding and acceptance.   Dental advisory given  Plan Discussed with: CRNA and Surgeon  Anesthesia Plan Comments:         Anesthesia Quick Evaluation

## 2018-01-12 NOTE — Anesthesia Procedure Notes (Signed)
Procedure Name: LMA Insertion Date/Time: 01/12/2018 7:35 AM Performed by: Justice Rocher, CRNA Pre-anesthesia Checklist: Patient identified, Emergency Drugs available, Suction available and Patient being monitored Patient Re-evaluated:Patient Re-evaluated prior to induction Oxygen Delivery Method: Circle system utilized Preoxygenation: Pre-oxygenation with 100% oxygen Induction Type: IV induction Ventilation: Mask ventilation without difficulty LMA: LMA inserted LMA Size: 4.0 Number of attempts: 1 Airway Equipment and Method: Bite block Placement Confirmation: positive ETCO2 and breath sounds checked- equal and bilateral Tube secured with: Tape Dental Injury: Teeth and Oropharynx as per pre-operative assessment

## 2018-01-12 NOTE — Transfer of Care (Signed)
Immediate Anesthesia Transfer of Care Note  Patient: Jasmine [name]  Procedure(s) Performed: Procedure(s) (LRB): EXCISION OF RIGHT OPEN BREAST WOUND (Right)  Patient Location: PACU  Anesthesia Type: General  Level of Consciousness: awake, sedated, patient cooperative and responds to stimulation  Airway & Oxygen Therapy: Patient Spontanous Breathing and Patient connected to face mask oxygen  Post-op Assessment: Report given to PACU RN, Post -op Vital signs reviewed and stable and Patient moving all extremities  Post vital signs: Reviewed and stable  Complications: No apparent anesthesia complications

## 2018-01-12 NOTE — Discharge Instructions (Signed)
Follow up in one week Drain care May shower starting Wednesday continue binder or sports bra    About my Jackson-Pratt Bulb Drain  What is a Jackson-Pratt bulb? A Jackson-Pratt is a soft, round device used to collect drainage. It is connected to a long, thin drainage catheter, which is held in place by one or two small stiches near your surgical incision site. When the bulb is squeezed, it forms a vacuum, forcing the drainage to empty into the bulb.  Emptying the Jackson-Pratt bulb- To empty the bulb: 1. Release the plug on the top of the bulb. 2. Pour the bulb's contents into a measuring container which your nurse will provide. 3. Record the time emptied and amount of drainage. Empty the drain(s) as often as your     doctor or nurse recommends.  Date                  Time                    Amount (Drain 1)                 Amount (Drain 2)  _____________________________________________________________________  _____________________________________________________________________  _____________________________________________________________________  _____________________________________________________________________  _____________________________________________________________________  _____________________________________________________________________  _____________________________________________________________________  _____________________________________________________________________  Squeezing the Jackson-Pratt Bulb- To squeeze the bulb: 1. Make sure the plug at the top of the bulb is open. 2. Squeeze the bulb tightly in your fist. You will hear air squeezing from the bulb. 3. Replace the plug while the bulb is squeezed. 4. Use a safety pin to attach the bulb to your clothing. This will keep the catheter from     pulling at the bulb insertion site.  When to call your doctor- Call your doctor if:  Drain site becomes red, swollen or hot.  You have a fever  greater than 101 degrees F.  There is oozing at the drain site.  Drain falls out (apply a guaze bandage over the drain hole and secure it with tape).  Drainage increases daily not related to activity patterns. (You will usually have more drainage when you are active than when you are resting.)  Drainage has a bad odor.         Post Anesthesia Home Care Instructions  Activity: Get plenty of rest for the remainder of the day. A responsible individual must stay with you for 24 hours following the procedure.  For the next 24 hours, DO NOT: -Drive a car -Paediatric nurse -Drink alcoholic beverages -Take any medication unless instructed by your physician -Make any legal decisions or sign important papers.  Meals: Start with liquid foods such as gelatin or soup. Progress to regular foods as tolerated. Avoid greasy, spicy, heavy foods. If nausea and/or vomiting occur, drink only clear liquids until the nausea and/or vomiting subsides. Call your physician if vomiting continues.  Special Instructions/Symptoms: Your throat may feel dry or sore from the anesthesia or the breathing tube placed in your throat during surgery. If this causes discomfort, gargle with warm salt water. The discomfort should disappear within 24 hours.  If you had a scopolamine patch placed behind your ear for the management of post- operative nausea and/or vomiting:  1. The medication in the patch is effective for 72 hours, after which it should be removed.  Wrap patch in a tissue and discard in the trash. Wash hands thoroughly with soap and water. 2. You may remove the patch earlier than 72 hours if you experience unpleasant side effects  which may include dry mouth, dizziness or visual disturbances. 3. Avoid touching the patch. Wash your hands with soap and water after contact with the patch.

## 2018-01-12 NOTE — Anesthesia Postprocedure Evaluation (Signed)
Anesthesia Post Note  Patient: Jasmine [name]  Procedure(s) Performed: EXCISION OF RIGHT OPEN BREAST WOUND (Right Breast)     Patient location during evaluation: PACU Anesthesia Type: General Level of consciousness: awake and alert Pain management: pain level controlled Vital Signs Assessment: post-procedure vital signs reviewed and stable Respiratory status: spontaneous breathing, nonlabored ventilation, respiratory function stable and patient connected to nasal cannula oxygen Cardiovascular status: blood pressure returned to baseline and stable Postop Assessment: no apparent nausea or vomiting Anesthetic complications: no    Last Vitals:  Vitals:   01/12/18 0930 01/12/18 0943  BP: 112/60 (!) 125/52  Pulse: 88 87  Resp: 12 12  Temp:  36.5 C  SpO2: 98% 100%    Last Pain:  Vitals:   01/12/18 0943  TempSrc: Oral  PainSc: 4                  Shiraz Bastyr S

## 2018-01-12 NOTE — Op Note (Signed)
DATE OF OPERATION: 01/12/2018  LOCATION: [facility]  PREOPERATIVE DIAGNOSIS: Right breast wound after mastectomy for cancer  POSTOPERATIVE DIAGNOSIS: Same  PROCEDURE: Excision of right breast wound (4 x 5  Cm) skin, soft tissue and capsulectomy  SURGEON: Vallorie Niccoli [place] Jordann Grime, DO  EBL: 10 cc  CONDITION: Stable  COMPLICATIONS: None  INDICATION: The patient, Jasmine [name], is a 51 y.o. female born on [birth year], is here for treatment of a right breast wound.  She underwent a mastectomy with immediate reconstruction.  She then had breakdown and a seroma with eventual exposure after receiving chemotherapy.  The decision was made for removal.  She is still smoking and now has a small wound of the right side.   PROCEDURE DETAILS:  The patient was seen prior to surgery and marked.  The IV antibiotics were given. The patient was taken to the operating room and given a general anesthetic. A standard time out was performed and all information was confirmed by those in the room. SCDs were placed.   The chest and right breast was prepped and draped.  The area was marked and local injected. An anchor shaped make was made to include the open area and the horizontal scar.  The #10 blade was used to make the incision and excise the skin and soft tissue of the 4 x 5 cm wound including the scar tissue.  This opened the area to see the capsule and contracture of the tissue deep.  The area was irrigated with antibiotic solution.  The capsule was excised completely from the flap.  This helped release the tissue significantly to open it up and gain closure with less tension.  Hemostasis was achieved with electrocautery.  The deep layers were closed with the 3-0 Monocryl.  A #15 drain was placed and secure to the chest skin with the 3-0 Silk.  The 4-0 and 5-0 Monocryl was used to close the remaining layers.  Bactroban ointment was applied with ABD and breast binder.  The patient was allowed to  wake up and taken to recovery room in stable condition at the end of the case. The family was notified at the end of the case. There was no sign of active infection or purulence.

## 2018-01-13 ENCOUNTER — Encounter (HOSPITAL_BASED_OUTPATIENT_CLINIC_OR_DEPARTMENT_OTHER): Payer: Self-pay | Admitting: Plastic Surgery

## 2018-01-23 DIAGNOSIS — I517 Cardiomegaly: Secondary | ICD-10-CM | POA: Diagnosis not present

## 2018-01-23 DIAGNOSIS — C50511 Malignant neoplasm of lower-outer quadrant of right female breast: Secondary | ICD-10-CM

## 2018-01-23 IMAGING — US ULTRASOUND RIGHT BREAST LIMITED
1 series · 13 of 14 positions shown · non-contrast
Comparison: Mammography 07/14/2017, 07/10/2016 and earlier.

CLINICAL DATA: Recall from screening mammography with
tomosynthesis, calcifications and a possible mass involving the
lower outer subareolar right breast.

EXAM:
DIGITAL DIAGNOSTIC RIGHT MAMMOGRAM WITH CAD
ULTRASOUND RIGHT BREAST

[Series 1: ultrasound right breast limited · 0.06mm/px · 13 of 14 slices shown]
[im 1/14]
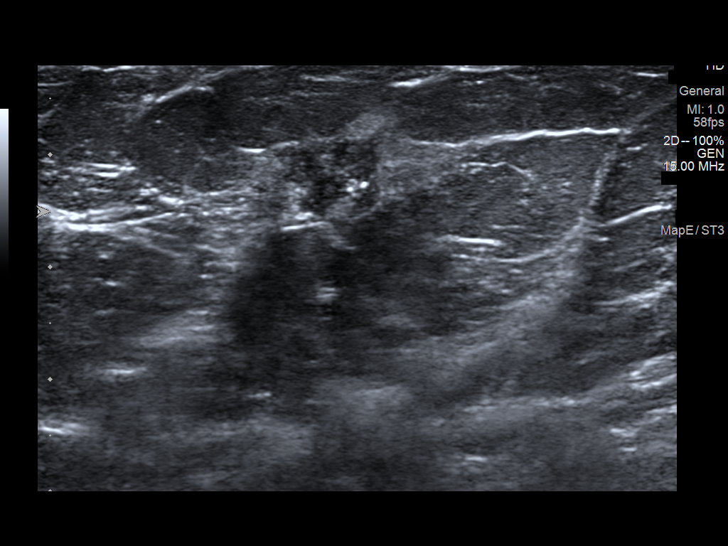
[im 2/14]
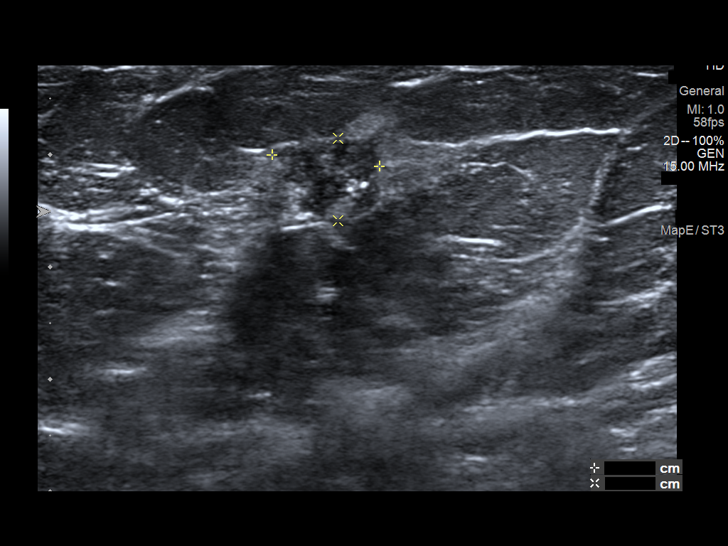
[im 3/14]
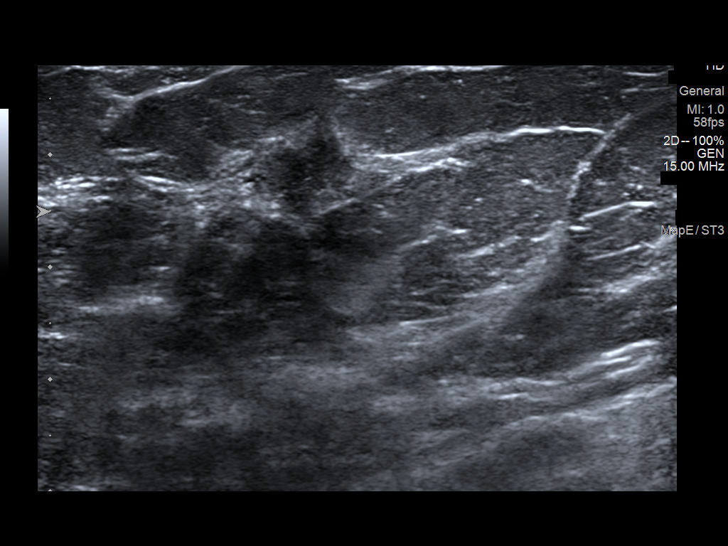
[im 4/14]
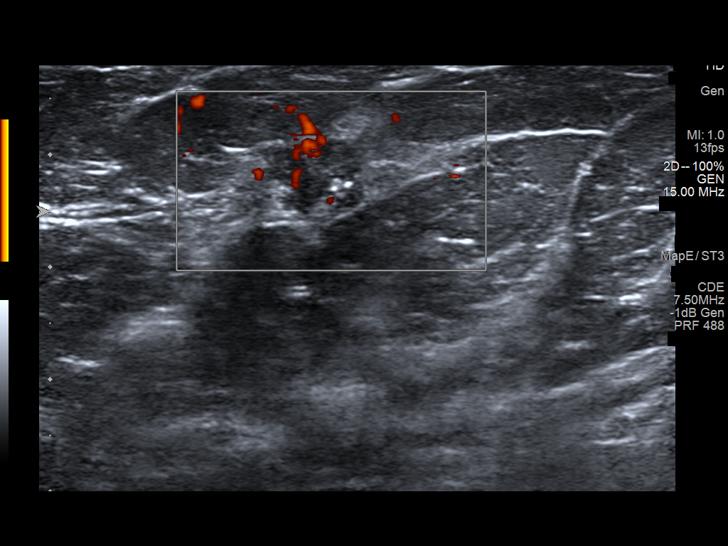
[im 5/14]
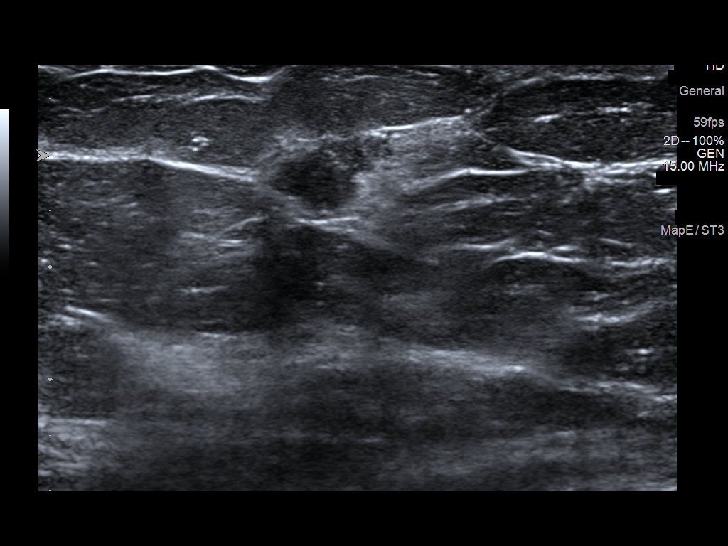
[im 6/14]
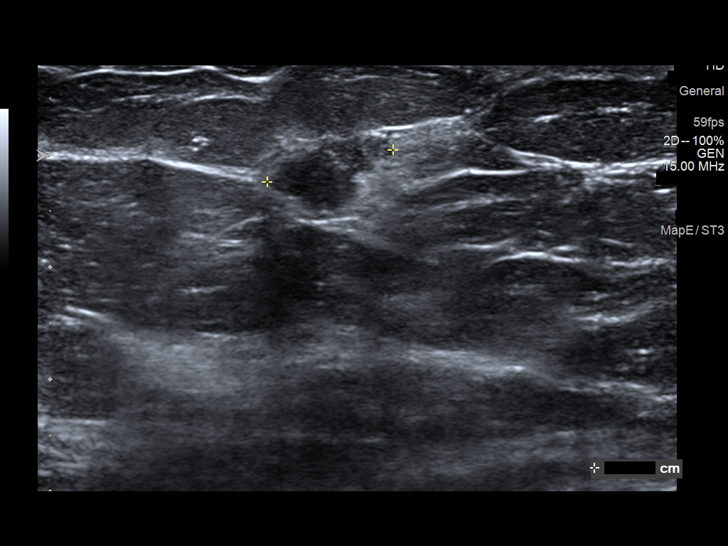
[im 8/14]
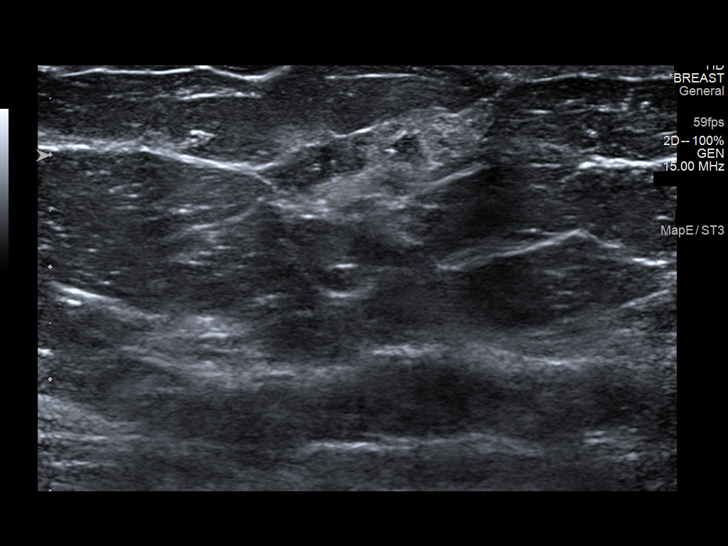
[im 9/14]
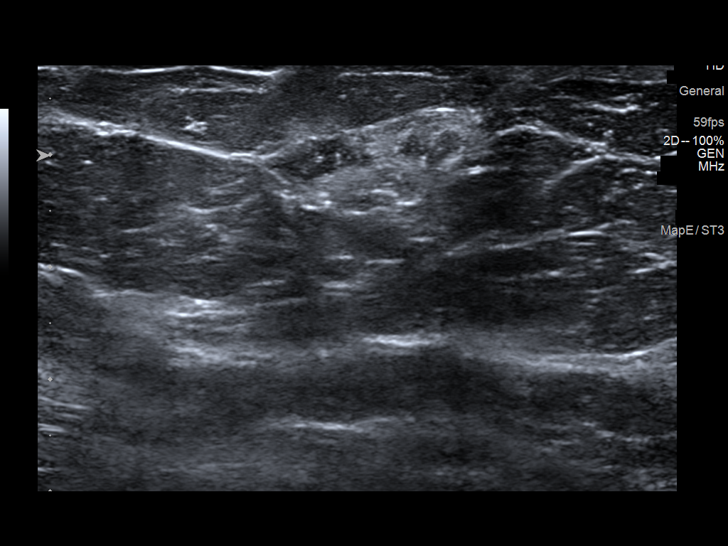
[im 10/14]
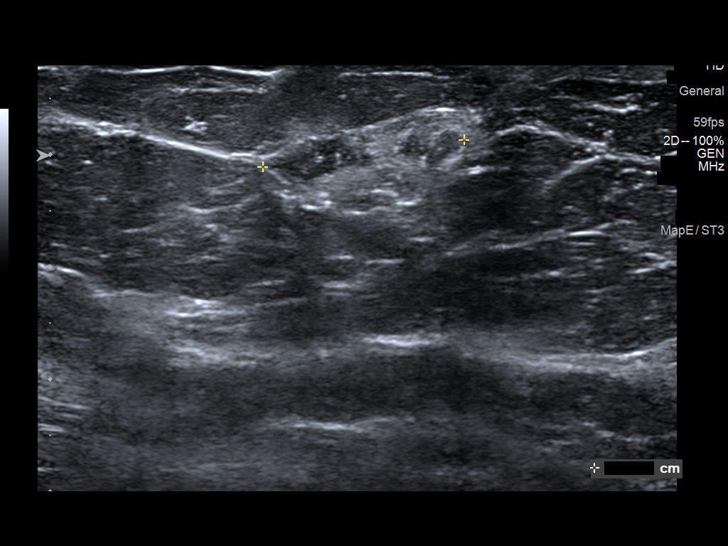
[im 11/14]
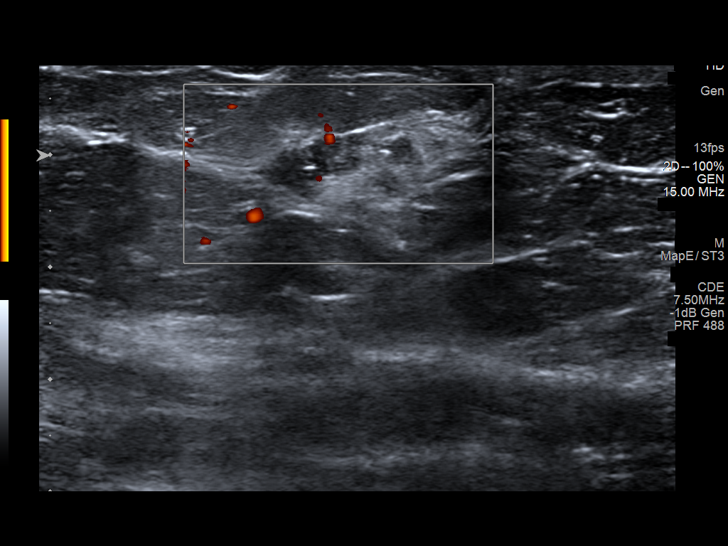
[im 12/14]
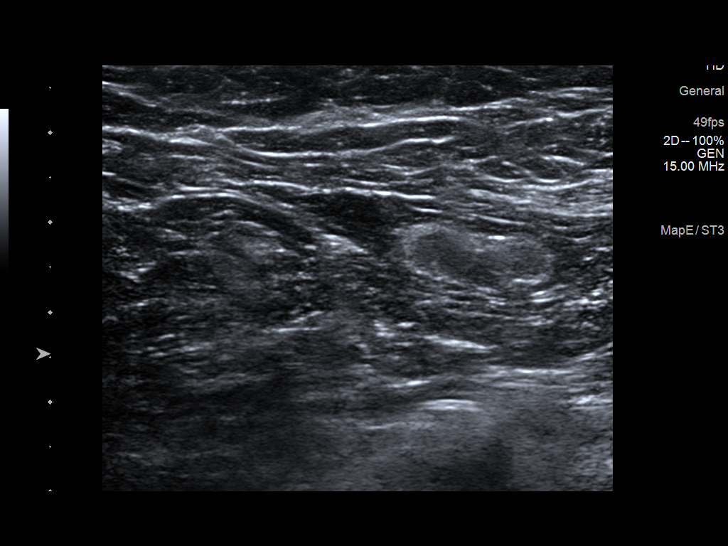
[im 13/14]
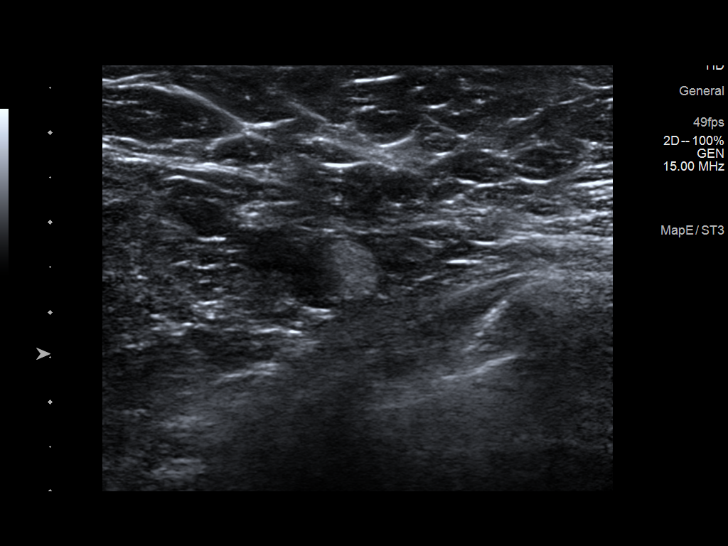
[im 14/14]
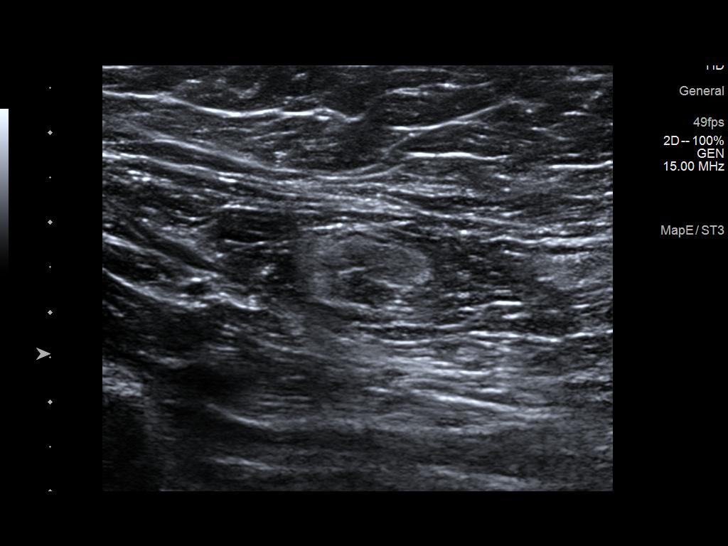

[13 of 14 positions shown; findings below may reference images not displayed]

No
prior right breast ultrasound.

ACR Breast Density Category b: There are scattered areas of
fibroglandular density.
FINDINGS: Standard spot magnification CC and mediolateral views of the right
breast calcifications and a standard 2D full field mediolateral view
of the right breast were obtained.

Spot magnification views demonstrate segmental fine heterogeneous
calcifications in the lower outer right breast at anterior depth,
extending from the nipple posteriorly and spanning approximately 5
cm. The mass questioned on the screening mammogram is confirmed on
the mediolateral image and measures approximately 0.9 cm.

The full field mediolateral image was processed with CAD.

On physical exam, there is a firm lump in the lower inner
periareolar right breast.

Targeted right breast ultrasound is performed, showing a hypoechoic
mass with irregular margins associated with microcalcifications at
the 8 o'clock position approximately 1 cm from the nipple measuring
approximately 0.7 x 1.0 x 1.2 cm, demonstrating mixed posterior
characteristics and demonstrating internal power Doppler flow,
corresponding to the area of concern on mammography. Adjacent to
this are dilated ducts containing hypoechoic material and
microcalcifications measuring in total approximately 1.8 cm.

Sonographic evaluation of the right axilla demonstrates no
pathologic lymphadenopathy.
IMPRESSION: 1. Highly suspicious 1.2 cm mass in the lower outer subareolar right
breast at the 8 o'clock position approximately 1 cm from the nipple.
2. Suspicious segmental calcifications spanning approximately 5 cm
in the lower outer right breast at anterior depth, extending to the
nipple. The suspicious mass is located within this segment of
calcifications.
3. No pathologic right axillary lymphadenopathy.

RECOMMENDATION:
1. Ultrasound-guided core needle biopsy of the suspicious mass in
the lower outer subareolar right breast.
2. Stereotactic core needle biopsy of the posterior extent of the
suspicious calcifications in the lower outer quadrant at anterior
depth.
As the patient works at [employer], she wishes to have the
biopsies done there for insurance purposes. I have notified the
[REDACTED] at [REDACTED] and the patient will be scheduled.
The [REDACTED] at [REDACTED] will contact the patient with
her appointment time.

I have discussed the findings and recommendations with the patient.
Results were also provided in writing at the conclusion of the
visit.

BI-RADS CATEGORY  5: Highly suggestive of malignancy.

## 2018-01-29 DIAGNOSIS — Z170 Estrogen receptor positive status [ER+]: Secondary | ICD-10-CM

## 2018-01-29 DIAGNOSIS — C50511 Malignant neoplasm of lower-outer quadrant of right female breast: Secondary | ICD-10-CM

## 2018-01-29 DIAGNOSIS — Z9011 Acquired absence of right breast and nipple: Secondary | ICD-10-CM | POA: Diagnosis not present

## 2018-02-19 DIAGNOSIS — Z170 Estrogen receptor positive status [ER+]: Secondary | ICD-10-CM

## 2018-02-19 DIAGNOSIS — C50511 Malignant neoplasm of lower-outer quadrant of right female breast: Secondary | ICD-10-CM | POA: Diagnosis not present

## 2018-02-19 DIAGNOSIS — Z9011 Acquired absence of right breast and nipple: Secondary | ICD-10-CM | POA: Diagnosis not present

## 2018-03-12 DIAGNOSIS — Z9221 Personal history of antineoplastic chemotherapy: Secondary | ICD-10-CM | POA: Diagnosis not present

## 2018-03-12 DIAGNOSIS — Z170 Estrogen receptor positive status [ER+]: Secondary | ICD-10-CM | POA: Diagnosis not present

## 2018-03-12 DIAGNOSIS — Z9011 Acquired absence of right breast and nipple: Secondary | ICD-10-CM | POA: Diagnosis not present

## 2018-03-12 DIAGNOSIS — Z79811 Long term (current) use of aromatase inhibitors: Secondary | ICD-10-CM | POA: Diagnosis not present

## 2018-03-12 DIAGNOSIS — C50511 Malignant neoplasm of lower-outer quadrant of right female breast: Secondary | ICD-10-CM | POA: Diagnosis not present

## 2018-05-14 DIAGNOSIS — C50119 Malignant neoplasm of central portion of unspecified female breast: Secondary | ICD-10-CM | POA: Diagnosis not present

## 2018-05-14 DIAGNOSIS — I517 Cardiomegaly: Secondary | ICD-10-CM | POA: Diagnosis not present

## 2018-06-04 DIAGNOSIS — Z170 Estrogen receptor positive status [ER+]: Secondary | ICD-10-CM

## 2018-06-04 DIAGNOSIS — C50511 Malignant neoplasm of lower-outer quadrant of right female breast: Secondary | ICD-10-CM

## 2018-06-04 DIAGNOSIS — Z9221 Personal history of antineoplastic chemotherapy: Secondary | ICD-10-CM

## 2018-06-04 DIAGNOSIS — Z9011 Acquired absence of right breast and nipple: Secondary | ICD-10-CM

## 2018-06-04 DIAGNOSIS — Z79811 Long term (current) use of aromatase inhibitors: Secondary | ICD-10-CM | POA: Diagnosis not present

## 2018-08-20 DIAGNOSIS — C50511 Malignant neoplasm of lower-outer quadrant of right female breast: Secondary | ICD-10-CM | POA: Diagnosis not present

## 2018-08-21 ENCOUNTER — Encounter: Payer: Self-pay | Admitting: Plastic Surgery

## 2018-08-21 ENCOUNTER — Ambulatory Visit (INDEPENDENT_AMBULATORY_CARE_PROVIDER_SITE_OTHER): Payer: Commercial Managed Care - PPO | Admitting: Plastic Surgery

## 2018-08-21 VITALS — Resp 12 | Ht 62.0 in | Wt 137.0 lb

## 2018-08-21 DIAGNOSIS — C50411 Malignant neoplasm of upper-outer quadrant of right female breast: Secondary | ICD-10-CM | POA: Diagnosis not present

## 2018-08-21 DIAGNOSIS — N6489 Other specified disorders of breast: Secondary | ICD-10-CM

## 2018-08-21 DIAGNOSIS — Z170 Estrogen receptor positive status [ER+]: Secondary | ICD-10-CM | POA: Diagnosis not present

## 2018-08-21 NOTE — Progress Notes (Signed)
Patient ID: Jasmine [name], female    DOB: 03-25-1966, 52 y.o.   MRN: [id number]   Chief Complaint  Patient presents with  . Breast Problem    The patient is a 52 yrs old wf here for follow up on her breast reconstruction.  She underwent bilateral mastectomies for a right invasive and in situ ductal carcinoma grade I I (PR / ER positive, HER 2 positive).  She had immediate bilateral reconstruction with TE and ADM on 09/2017.  Due to chemo and tobacco use there was a complication on the right side and the expander was removed.  She now has 510 cc in the left expander and no right expander.  Her pre op bra size was 36 D cup.  She is down to 10 - 15 cig / day.  She is interested in further reconstruction.   Review of Systems  Constitutional: Negative.   HENT: Negative.   Eyes: Negative.   Respiratory: Negative.   Cardiovascular: Negative.   Gastrointestinal: Negative.   Endocrine: Negative.   Genitourinary: Negative.   Skin: Negative.  Negative for color change and wound.  Neurological: Negative.   Hematological: Negative.   Psychiatric/Behavioral: Negative.     Past Medical History:  Diagnosis Date  . Anxiety   . Cancer [facility])    right breast cancer  . Depression     Past Surgical History:  Procedure Laterality Date  . ABDOMINAL HYSTERECTOMY  2008  . BREAST RECONSTRUCTION WITH PLACEMENT OF TISSUE EXPANDER AND FLEX HD (ACELLULAR HYDRATED DERMIS) Bilateral 10/01/2017   Procedure: BILATERAL BREAST RECONSTRUCTION WITH PLACEMENT OF TISSUE EXPANDER AND FLEX HD (ACELLULAR HYDRATED DERMIS);  Surgeon: Wallace Going, DO;  Location: [place];  Service: Plastics;  Laterality: Bilateral;  . CHOLECYSTECTOMY  2008  . DEBRIDEMENT AND CLOSURE WOUND Right 11/05/2017   Procedure: DEBRIDEMENT OF RIGHT BREAST WITH CLOSURE WOUND;  Surgeon: Wallace Going, DO;  Location: [place];  Service: Plastics;  Laterality: Right;  . DEBRIDEMENT AND CLOSURE WOUND Right 01/12/2018    Procedure: EXCISION OF RIGHT OPEN BREAST WOUND;  Surgeon: Wallace Going, DO;  Location: [place];  Service: Plastics;  Laterality: Right;  . MASTECTOMY W/ SENTINEL NODE BIOPSY Right 10/01/2017   Procedure: RIGHT  MASTECTOMY WITH RIGHT SENTINEL LYMPH NODE BIOPSY;  Surgeon: Coralie Keens, MD;  Location: [place];  Service: General;  Laterality: Right;  . SIMPLE MASTECTOMY WITH AXILLARY SENTINEL NODE BIOPSY Left 10/01/2017   Procedure: Left Mastectomy;  Surgeon: Coralie Keens, MD;  Location: [place];  Service: General;  Laterality: Left;  . WISDOM TOOTH EXTRACTION  1984      Current Outpatient Medications:  .  ALPRAZolam (XANAX) 0.5 MG tablet, Take 0.5 mg by mouth at bedtime. , Disp: , Rfl:  .  amphetamine-dextroamphetamine (ADDERALL XR) 30 MG 24 hr capsule, Take 30 mg by mouth daily., Disp: , Rfl: 0 .  Dexamethasone (DECADRON PO), Take 4 mg by mouth., Disp: , Rfl:  .  HYDROcodone-acetaminophen (NORCO/VICODIN) 5-325 MG tablet, Take 1 tablet every 6 (six) hours as needed by mouth for moderate pain., Disp: , Rfl:  .  ibuprofen (ADVIL,MOTRIN) 200 MG tablet, Take 200-400 mg by mouth every 6 (six) hours as needed (for pain or headaches). , Disp: , Rfl:  .  loratadine (CLARITIN) 10 MG tablet, Take 10 mg by mouth daily., Disp: , Rfl:  .  ondansetron (ZOFRAN) 4 MG tablet, Take 4 mg by mouth every  8 (eight) hours as needed for nausea or vomiting., Disp: , Rfl:  .  vitamin A 10000 UNIT capsule, Take 1 capsule (10,000 Units total) by mouth daily., Disp: 30 capsule, Rfl: 1   Objective:   Vitals:   08/21/18 1315  Resp: 12    Physical Exam  Constitutional: She appears well-developed and well-nourished.  HENT:  Head: Normocephalic and atraumatic.  Eyes: Pupils are equal, round, and reactive to light. EOM are normal.  Cardiovascular: Normal rate.  Pulmonary/Chest: Effort normal.    Neurological: She is alert.  Skin: Skin is warm.      Assessment & Plan:  Malignant neoplasm of upper-outer quadrant of right breast in female, estrogen receptor positive (HCC)  Postoperative breast asymmetry  Plan for latissimus muscle flap to the right breast with expander placement.  Then we will exchange the expanders for implants.  [place], DO

## 2018-08-31 NOTE — Pre-Procedure Instructions (Signed)
SHANDON MATSON  08/31/2018      [facility] - [place], [place] - [place] [address] Phone: [phone] Fax: [phone]    Your procedure is scheduled on October 21st.  Report to [place] at [phone] A.M.  Call this number if you have problems the morning of surgery:  [phone]   Remember:  Do not eat or drink after midnight    Take these medicines the morning of surgery with A SIP OF WATER   Hydrocodone (if needed)  Femara  7 days prior to surgery STOP taking any Aspirin(unless otherwise instructed by your surgeon), Aleve, Naproxen, Ibuprofen, Motrin, Advil, Goody's, BC's, all herbal medications, fish oil, and all vitamins     Do not wear jewelry, make-up or nail polish.  Do not wear lotions, powders, or perfumes, or deodorant.  Do not shave 48 hours prior to surgery.  Men may shave face and neck.  Do not bring valuables to the hospital.  [facility] is not responsible for any belongings or valuables.  Contacts, dentures or bridgework may not be worn into surgery.  Leave your suitcase in the car.  After surgery it may be brought to your room.  For patients admitted to the hospital, discharge time will be determined by your treatment team.  Patients discharged the day of surgery will not be allowed to drive home.    [facility]- Preparing For Surgery  Before surgery, you can play an important role. Because skin is not sterile, your skin needs to be as free of germs as possible. You can reduce the number of germs on your skin by washing with CHG (chlorahexidine gluconate) Soap before surgery.  CHG is an antiseptic cleaner which kills germs and bonds with the skin to continue killing germs even after washing.    Oral Hygiene is also important to reduce your risk of infection.  Remember - BRUSH YOUR TEETH THE MORNING OF SURGERY WITH YOUR REGULAR TOOTHPASTE  Please do not use if you have an allergy to  CHG or antibacterial soaps. If your skin becomes reddened/irritated stop using the CHG.  Do not shave (including legs and underarms) for at least 48 hours prior to first CHG shower. It is OK to shave your face.  Please follow these instructions carefully.   1. Shower the NIGHT BEFORE SURGERY and the MORNING OF SURGERY with CHG.   2. If you chose to wash your hair, wash your hair first as usual with your normal shampoo.  3. After you shampoo, rinse your hair and body thoroughly to remove the shampoo.  4. Use CHG as you would any other liquid soap. You can apply CHG directly to the skin and wash gently with a scrungie or a clean washcloth.   5. Apply the CHG Soap to your body ONLY FROM THE NECK DOWN.  Do not use on open wounds or open sores. Avoid contact with your eyes, ears, mouth and genitals (private parts). Wash Face and genitals (private parts)  with your normal soap.  6. Wash thoroughly, paying special attention to the area where your surgery will be performed.  7. Thoroughly rinse your body with warm water from the neck down.  8. DO NOT shower/wash with your normal soap after using and rinsing off the CHG Soap.  9. Pat yourself dry with a CLEAN TOWEL.  10. Wear CLEAN PAJAMAS to bed the night before surgery, wear comfortable clothes  the morning of surgery  11. Place CLEAN SHEETS on your bed the night of your first shower and DO NOT SLEEP WITH PETS.    Day of Surgery:  Do not apply any deodorants/lotions.  Please wear clean clothes to the hospital/surgery center.   Remember to brush your teeth WITH YOUR REGULAR TOOTHPASTE.    Please read over the following fact sheets that you were given.

## 2018-09-01 ENCOUNTER — Inpatient Hospital Stay (HOSPITAL_COMMUNITY)
Admission: RE | Admit: 2018-09-01 | Discharge: 2018-09-01 | Disposition: A | Discharge status: Home / Self Care | Payer: Commercial Managed Care - PPO | Source: Ambulatory Visit

## 2018-09-01 ENCOUNTER — Ambulatory Visit: Payer: Self-pay | Admitting: Plastic Surgery

## 2018-09-01 ENCOUNTER — Encounter: Payer: Self-pay | Admitting: Plastic Surgery

## 2018-09-01 DIAGNOSIS — Z9013 Acquired absence of bilateral breasts and nipples: Secondary | ICD-10-CM

## 2018-09-01 DIAGNOSIS — Z170 Estrogen receptor positive status [ER+]: Secondary | ICD-10-CM

## 2018-09-01 DIAGNOSIS — C50411 Malignant neoplasm of upper-outer quadrant of right female breast: Secondary | ICD-10-CM

## 2018-09-01 NOTE — Progress Notes (Signed)
Patient ID: Jasmine [name], female    DOB: Feb 26, 1966, 52 y.o.   MRN: [id number]   Chief Complaint  Patient presents with  . Breast Cancer    The patient is a 52 year old white female here for her preoperative history and physical for breast reconstruction.  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  She is a smoker.  She had breakdown on the right skin flaps which required revision and then removal of the expander.  She has done extremely well going down to 4 to 5 cigarettes/day.  There is no sign of hematoma seroma or infection in either breast at this time.  The skin is well-healed.  We discussed the risks and complications of surgery.  She is 5 feet 2 inches tall.  Weight is 137 pounds.  Her preop bra = 36 D.   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 She underwent a lap choley in the past and is a smoker. Immediate bilateral breast reconstruction with TE/ADM on 10/01/17.  Developed a febrile illness/sepsis/influenza following her first chemotherapy treatment and had to be hospitalized 12/17. Right expander removed 11/05/17.  Review of Systems  Constitutional: Negative.   HENT: Negative.   Eyes: Negative.   Respiratory: Negative.   Cardiovascular: Negative.   Gastrointestinal: Negative.   Endocrine: Negative.   Genitourinary: Negative.   Musculoskeletal: Negative.   Neurological: Negative.   Hematological: Negative.   Psychiatric/Behavioral: Negative.     Past Medical History:  Diagnosis Date  . Anxiety   . Cancer [facility])    right breast cancer  . Depression     Past Surgical History:  Procedure Laterality Date  . ABDOMINAL HYSTERECTOMY  2008  . BREAST RECONSTRUCTION WITH PLACEMENT OF TISSUE EXPANDER AND FLEX HD  (ACELLULAR HYDRATED DERMIS) Bilateral 10/01/2017   Procedure: BILATERAL BREAST RECONSTRUCTION WITH PLACEMENT OF TISSUE EXPANDER AND FLEX HD (ACELLULAR HYDRATED DERMIS);  Surgeon: Wallace Going, DO;  Location: [facility];  Service: Plastics;  Laterality: Bilateral;  . CHOLECYSTECTOMY  2008  . DEBRIDEMENT AND CLOSURE WOUND Right 11/05/2017   Procedure: DEBRIDEMENT OF RIGHT BREAST WITH CLOSURE WOUND;  Surgeon: Wallace Going, DO;  Location: [facility];  Service: Plastics;  Laterality: Right;  . DEBRIDEMENT AND CLOSURE WOUND Right 01/12/2018   Procedure: EXCISION OF RIGHT OPEN BREAST WOUND;  Surgeon: Wallace Going, DO;  Location: [facility];  Service: Plastics;  Laterality: Right;  . MASTECTOMY W/ SENTINEL NODE BIOPSY Right 10/01/2017   Procedure: RIGHT  MASTECTOMY WITH RIGHT SENTINEL LYMPH NODE BIOPSY;  Surgeon: Coralie Keens, MD;  Location: [facility];  Service: General;  Laterality: Right;  . SIMPLE MASTECTOMY WITH AXILLARY SENTINEL NODE BIOPSY Left 10/01/2017   Procedure: Left Mastectomy;  Surgeon: Coralie Keens, MD;  Location: [facility];  Service: General;  Laterality: Left;  . WISDOM TOOTH EXTRACTION  1984      Current Outpatient Medications:  .  ALPRAZolam (XANAX) 0.5 MG tablet, Take 0.5 mg by mouth at bedtime. , Disp: , Rfl:  .  amphetamine-dextroamphetamine (ADDERALL XR) 30 MG 24 hr capsule, Take 30 mg by mouth daily., Disp: , Rfl: 0 .  Ibuprofen 200 MG CAPS, Take 200-400 mg by mouth every 6 (six) hours as needed (  for pain or headaches). , Disp: , Rfl:  .  letrozole (FEMARA) 2.5 MG tablet, Take 2.5 mg by mouth daily., Disp: , Rfl:  .  ondansetron (ZOFRAN) 4 MG tablet, Take 4 mg by mouth every 8 (eight) hours as needed for nausea or vomiting., Disp: , Rfl:  .  vitamin A 10000 UNIT capsule, Take 1 capsule (10,000 Units total) by mouth daily., Disp: 30 capsule, Rfl: 1 .  HYDROcodone-acetaminophen  (NORCO/VICODIN) 5-325 MG tablet, Take 1 tablet every 6 (six) hours as needed by mouth for moderate pain., Disp: , Rfl:    Objective:   There were no vitals filed for this visit.  Physical Exam  Constitutional: She is oriented to person, place, and time. She appears well-developed and well-nourished.  HENT:  Head: Normocephalic and atraumatic.  Eyes: Pupils are equal, round, and reactive to light. EOM are normal.  Cardiovascular: Normal rate.  Pulmonary/Chest: Effort normal.  Abdominal: Soft.  Neurological: She is alert and oriented to person, place, and time.  Skin: Skin is warm.  Psychiatric: She has a normal mood and affect. Her behavior is normal. Judgment and thought content normal.    Assessment & Plan:  Malignant neoplasm of upper-outer quadrant of right breast in female, estrogen receptor positive ([place])  Acquired absence of breast and absent nipple, bilateral  We are planning for a latissimus myocutaneous flap to the right breast with expander placement next week.The risks that can be encountered with and after placement of a breast expander placement were discussed and include the following but not limited to these: bleeding, infection, delayed healing, anesthesia risks, skin sensation changes, injury to structures including nerves, blood vessels, and muscles which may be temporary or permanent, allergies to tape, suture materials and glues, blood products, topical preparations or injected agents, skin contour irregularities, skin discoloration and swelling, deep vein thrombosis, cardiac and pulmonary complications, pain, which may persist, fluid accumulation, wrinkling of the skin over the expander, changes in nipple or breast sensation, expander leakage or rupture, faulty position of the expander, persistent pain, formation of tight scar tissue around the expander (capsular contracture), possible need for revisional surgery or staged procedures.   [place], DO

## 2018-09-01 NOTE — Pre-Procedure Instructions (Addendum)
NEHAL WITTING  09/01/2018    Your procedure is scheduled on Monday, September 07, 2018 at 12:15 PM.   Report to [facility] Entrance "A" Admitting Office at 10:15 AM.   Call this number if you have problems the morning of surgery: [phone]   Questions prior to day of surgery, please call [phone] between 8 & 4 PM.   Remember:  Do not eat or drink after midnight Sunday, 09/06/18.  Take these medicines the morning of surgery with A SIP OF WATER: Letrozole (Femara), Hydrocodone - if needed  Stop NSAIDS (Ibuprofen, Aleve, etc) as of today prior to surgery. Do not use Aspirin products or Herbal medications prior to surgery.  Do not smoke 24 hours prior to surgery.    Do not wear jewelry, make-up or nail polish.  Do not wear lotions, powders, perfumes or deodorant.  Do not shave 48 hours prior to surgery.    Do not bring valuables to the hospital.  [facility] is not responsible for any belongings or valuables.  Contacts, dentures or bridgework may not be worn into surgery.  Leave your suitcase in the car.  After surgery it may be brought to your room.  For patients admitted to the hospital, discharge time will be determined by your treatment team.  Patients discharged the day of surgery will not be allowed to drive home.   [facility] - Preparing for Surgery  Before surgery, you can play an important role.  Because skin is not sterile, your skin needs to be as free of germs as possible.  You can reduce the number of germs on you skin by washing with CHG (chlorahexidine gluconate) soap before surgery.  CHG is an antiseptic cleaner which kills germs and bonds with the skin to continue killing germs even after washing.  Oral Hygiene is also important in reducing the risk of infection.  Remember to brush your teeth with your regular toothpaste the morning of surgery.  Please DO NOT use if you have an allergy to CHG or antibacterial soaps.  If your skin becomes  reddened/irritated stop using the CHG and inform your nurse when you arrive at Short Stay.  Do not shave (including legs and underarms) for at least 48 hours prior to the first CHG shower.  You may shave your face.  Please follow these instructions carefully:   1.  Shower with CHG Soap the night before surgery and the morning of Surgery.  2.  If you choose to wash your hair, wash your hair first as usual with your normal shampoo.  3.  After you shampoo, rinse your hair and body thoroughly to remove the shampoo. 4.  Use CHG as you would any other liquid soap.  You can apply chg directly to the skin and wash gently with a      scrungie or washcloth.           5.  Apply the CHG Soap to your body ONLY FROM THE NECK DOWN.   Do not use on open wounds or open sores. Avoid contact with your eyes, ears, mouth and genitals (private parts).  Wash genitals (private parts) with your normal soap.  6.  Wash thoroughly, paying special attention to the area where your surgery will be performed.  7.  Thoroughly rinse your body with warm water from the neck down.  8.  DO NOT shower/wash with your normal soap after using and rinsing off the CHG Soap.  9.  Pat yourself  dry with a clean towel.            10.  Wear clean pajamas.            11.  Place clean sheets on your bed the night of your first shower and do not sleep with pets.  Day of Surgery  Shower as above. Do not apply any lotions/deodorants the morning of surgery.   Please wear clean clothes to the hospital. Remember to brush your teeth with toothpaste.   Please read over the fact sheets that you were given.

## 2018-09-04 NOTE — Progress Notes (Signed)
Spoke with Makayla at Dr. Eusebio Friendly office to inform them that the patient stated she has cancelled her surgery and to have Dr. Marla Roe take it off the OR schedule.  She verbalized that she would call Dr. Marla Roe to do this.

## 2018-09-07 ENCOUNTER — Inpatient Hospital Stay (HOSPITAL_COMMUNITY)
Admission: RE | Admit: 2018-09-07 | Payer: Commercial Managed Care - PPO | Source: Ambulatory Visit | Admitting: Plastic Surgery

## 2018-09-07 ENCOUNTER — Encounter (HOSPITAL_COMMUNITY): Admission: RE | Payer: Self-pay | Source: Ambulatory Visit

## 2018-09-07 SURGERY — RECONSTRUCTION, BREAST, USING LATISSIMUS DORSI MYOCUTANEOUS FLAP
Anesthesia: General | Laterality: Right

## 2018-09-18 ENCOUNTER — Encounter: Payer: Commercial Managed Care - PPO | Admitting: Plastic Surgery

## 2018-10-08 DIAGNOSIS — Z79811 Long term (current) use of aromatase inhibitors: Secondary | ICD-10-CM

## 2018-10-08 DIAGNOSIS — C50511 Malignant neoplasm of lower-outer quadrant of right female breast: Secondary | ICD-10-CM | POA: Diagnosis not present

## 2018-10-08 DIAGNOSIS — Z9011 Acquired absence of right breast and nipple: Secondary | ICD-10-CM

## 2018-10-08 DIAGNOSIS — Z9221 Personal history of antineoplastic chemotherapy: Secondary | ICD-10-CM

## 2018-10-08 DIAGNOSIS — Z170 Estrogen receptor positive status [ER+]: Secondary | ICD-10-CM

## 2018-10-08 DIAGNOSIS — R21 Rash and other nonspecific skin eruption: Secondary | ICD-10-CM

## 2018-12-25 ENCOUNTER — Encounter: Payer: Self-pay | Admitting: Plastic Surgery

## 2018-12-25 ENCOUNTER — Ambulatory Visit: Payer: Commercial Managed Care - PPO | Admitting: Plastic Surgery

## 2018-12-25 VITALS — BP 122/73 | HR 86 | Temp 98.4°F | Ht 62.0 in | Wt 137.0 lb

## 2018-12-25 DIAGNOSIS — Z170 Estrogen receptor positive status [ER+]: Secondary | ICD-10-CM

## 2018-12-25 DIAGNOSIS — Z9013 Acquired absence of bilateral breasts and nipples: Secondary | ICD-10-CM | POA: Diagnosis not present

## 2018-12-25 DIAGNOSIS — C50411 Malignant neoplasm of upper-outer quadrant of right female breast: Secondary | ICD-10-CM

## 2018-12-25 NOTE — Progress Notes (Signed)
Subjective:    Patient ID: Jasmine [name], female    DOB: 23-Jul-1966, 53 y.o.   MRN: [id number]  The patient is a 53 year old white female here with her husband for evaluation for breast reconstruction.  She had bilateral mastectomies (09/2017) with attempts at reconstruction.  She had bilateral expanders placed with ADM. and this was for treatment of right invasive breast cancer, HER-2 positive, ER and PR positive.  She was going through the expansion when she came down with the flu and pneumonia after her first chemotherapy treatment.  She continued the smoking.  This affected the right side which had a late dehiscence of the incision site.  She then had an infection and required removal of the expander (12/18).  We were planning on doing a latissimus muscle flap but she had a lot of family issues with loss of her sister and other members of the family all around the same time.  She was also still smoking.  She is now 55 week smoke-free tobacco free and seems to be doing much better the left expander has 510/375 cc.  It is a good size for her.  She is 5 feet 2 inches tall and weighs 137 pounds.  Her preoperative bra size was a 36D.  She likes the size of the left breast now but would be interested in going a little larger if possible.  We will have to see if her skin and capsule allows any stretch.     Review of Systems  Constitutional: Negative.   HENT: Negative.   Eyes: Negative.   Respiratory: Negative.   Cardiovascular: Negative.   Gastrointestinal: Negative.   Endocrine: Negative.   Genitourinary: Negative.   Musculoskeletal: Negative.   Skin: Negative for color change and wound.  Neurological: Negative.   Psychiatric/Behavioral: Negative.        Objective:   Physical Exam Vitals signs and nursing note reviewed.  Constitutional:      Appearance: Normal appearance.  HENT:     Head: Normocephalic and atraumatic.     Right Ear: External ear normal.     Left Ear: External  ear normal.     Nose: Nose normal.     Mouth/Throat:     Mouth: Mucous membranes are moist.  Cardiovascular:     Rate and Rhythm: Normal rate.     Pulses: Normal pulses.  Pulmonary:     Effort: Pulmonary effort is normal. No respiratory distress.     Breath sounds: No wheezing.  Abdominal:     General: Abdomen is flat. There is no distension.     Tenderness: There is no abdominal tenderness. There is no guarding.  Skin:    General: Skin is warm.  Neurological:     General: No focal deficit present.     Mental Status: She is alert.       Assessment & Plan:  Acquired absence of breast and absent nipple, bilateral  Malignant neoplasm of upper-outer quadrant of right breast in female, estrogen receptor positive ([facility])  S/P mastectomy, bilateral  We discussed the options for reconstruction: she is interested in a latissimus muscle flap with expander placement.  She should plan on 1-2 nights in the hospital after the procedure.  She will have 2 drains in the back and one drain in the front.  We will expand her over a 2 to 54-month  She would then have a exchange with an implant.  We can try to increase the left side as we are  expanding the new right side.  She would then be eligible for nipple areole a tattoo after the final reconstruction. Plan for right latissimus muscle flap and expander placement.

## 2019-02-10 ENCOUNTER — Other Ambulatory Visit (HOSPITAL_COMMUNITY): Payer: Commercial Managed Care - PPO

## 2019-02-10 ENCOUNTER — Encounter: Payer: Commercial Managed Care - PPO | Admitting: Plastic Surgery

## 2019-02-16 ENCOUNTER — Inpatient Hospital Stay: Admit: 2019-02-16 | Payer: Commercial Managed Care - PPO | Admitting: Plastic Surgery

## 2019-02-16 SURGERY — RECONSTRUCTION, BREAST, USING LATISSIMUS DORSI MYOCUTANEOUS FLAP
Anesthesia: General | Site: Breast | Laterality: Right

## 2019-02-23 ENCOUNTER — Encounter: Payer: Commercial Managed Care - PPO | Admitting: Plastic Surgery

## 2019-02-25 IMAGING — DX DG CHEST 2V
2 series · 2 of 2 positions shown · non-contrast
Comparison: 11/05/2017

CLINICAL DATA: Cough and right chest pain.

EXAM:
CHEST  2 VIEW

[w chest pa]
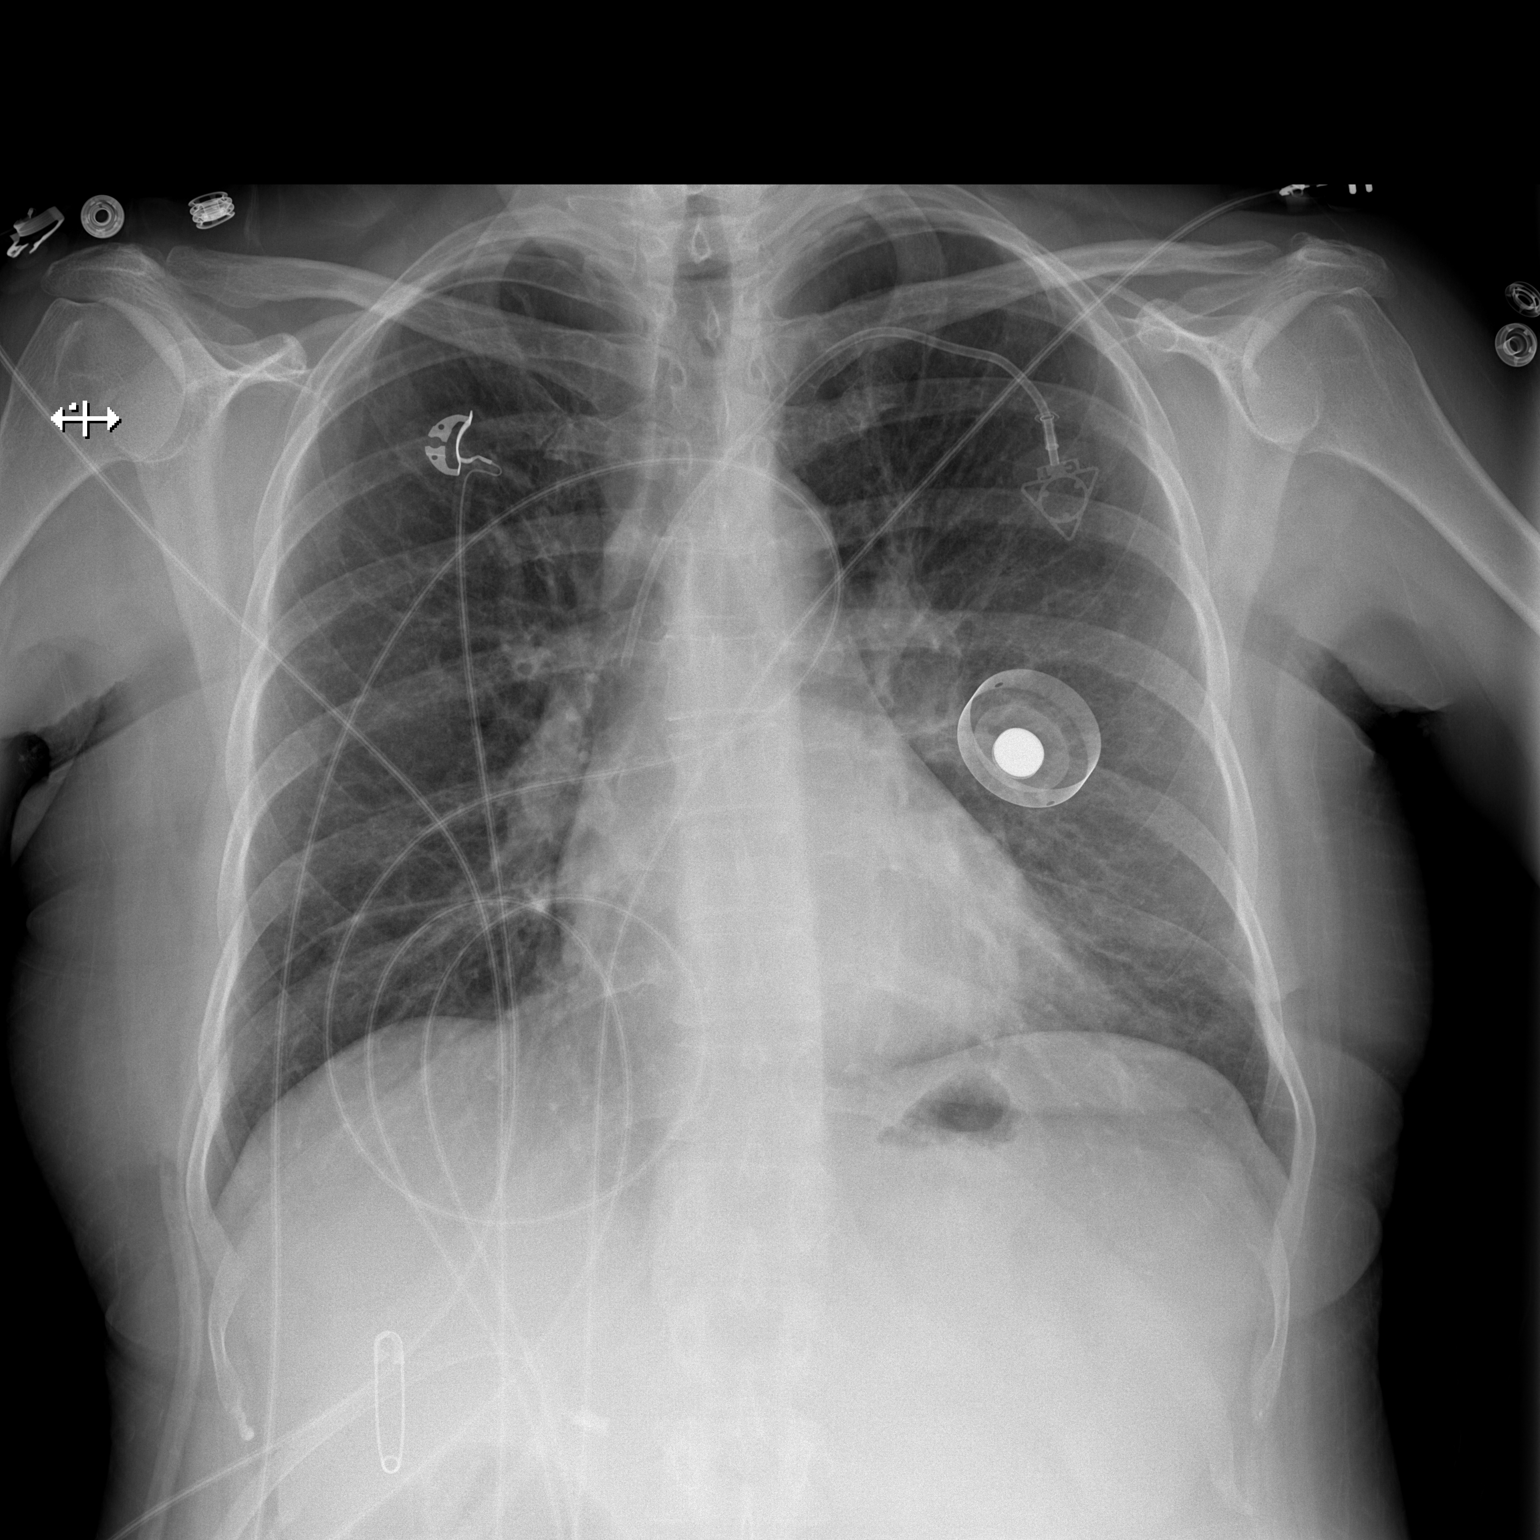

[w chest lat]
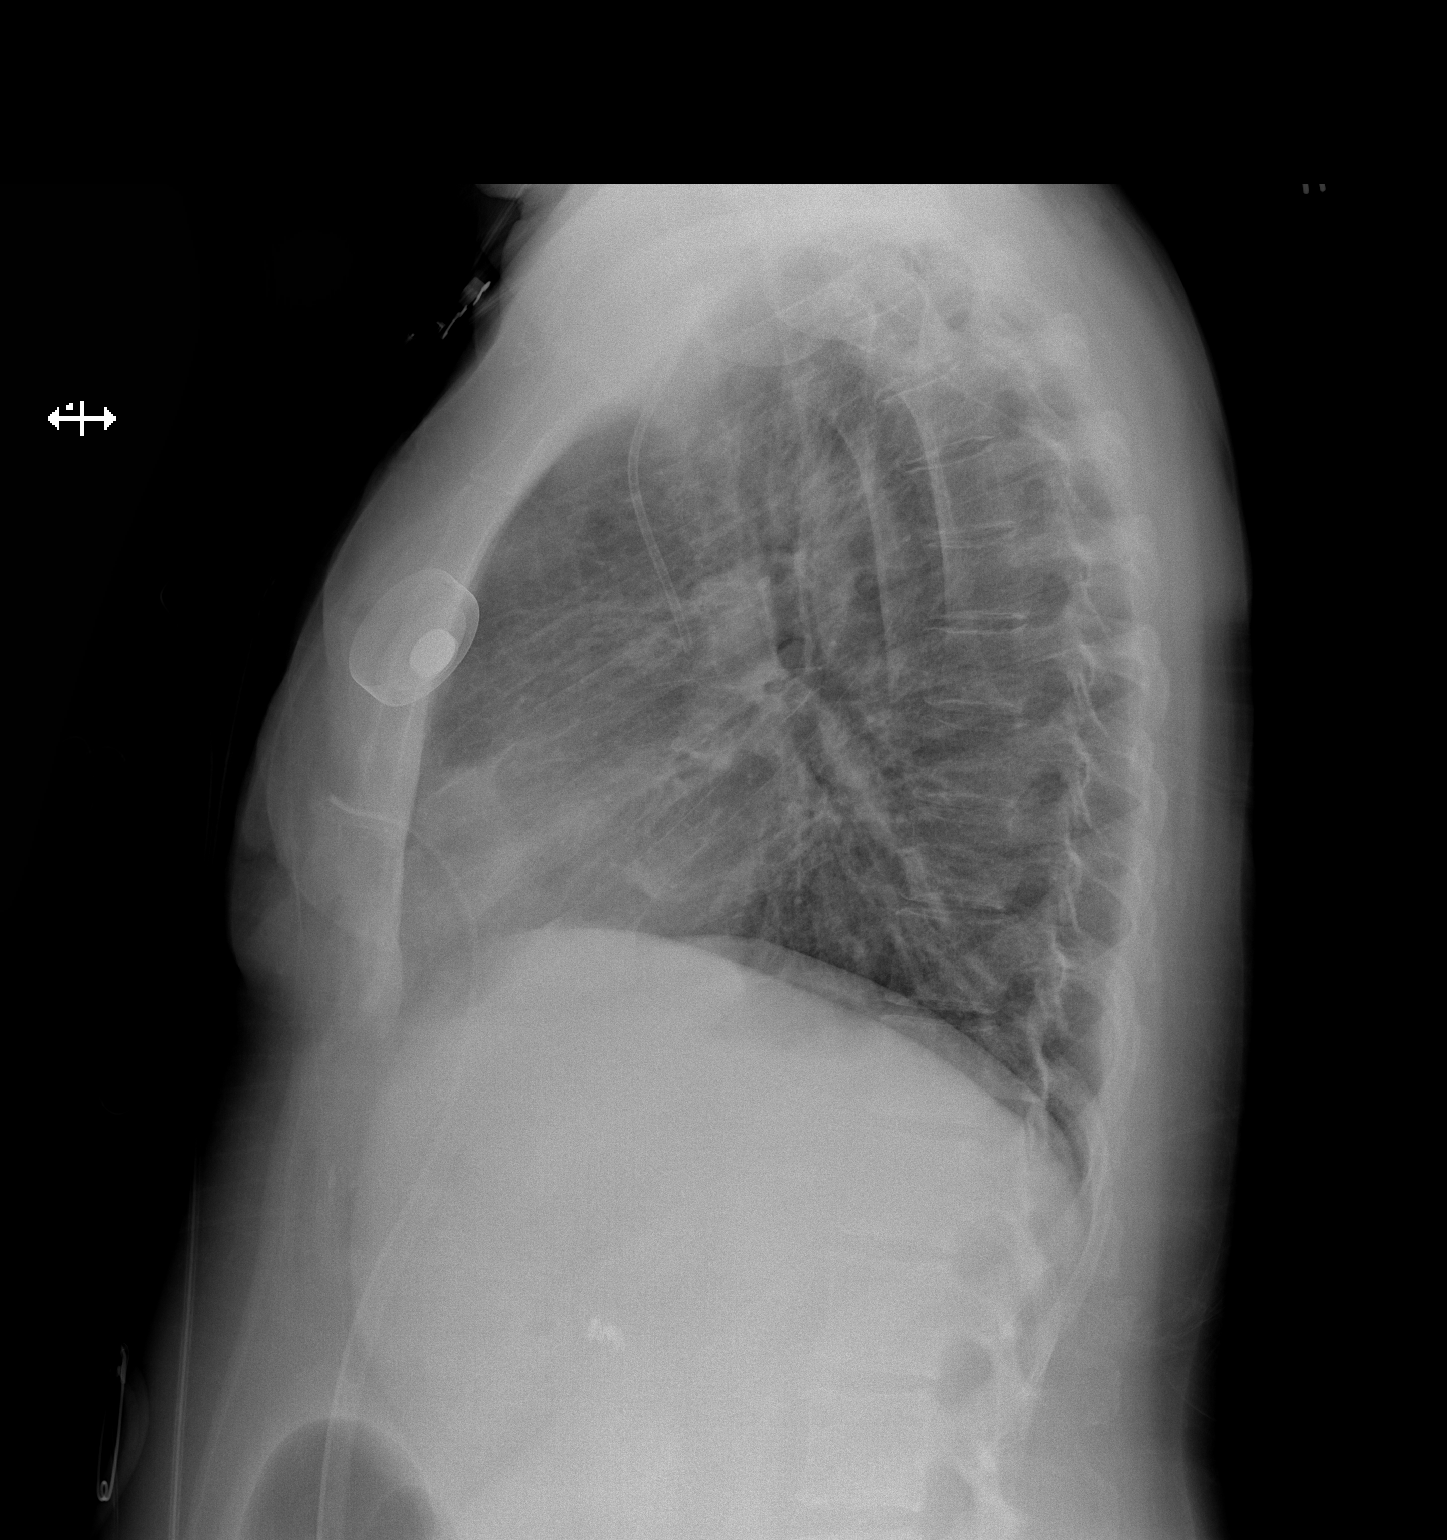

[2 of 2 positions shown; findings below may reference images not displayed]

FINDINGS: Normal heart size and mediastinal contours. Right-sided tissue
expander has been removed and there is now a right-sided chest wall
drain. Left-sided porta catheter. There is no edema, consolidation,
effusion, or pneumothorax. Mildly low volumes in the lateral
projection.
IMPRESSION: No evidence of active disease.

## 2019-04-01 ENCOUNTER — Telehealth: Payer: Self-pay | Admitting: Plastic Surgery

## 2019-04-01 NOTE — Telephone Encounter (Signed)
Called patient to confirm appointment scheduled for tomorrow. Patient answered the following questions: °1.Has the patient traveled outside of the state of [place] at all within the past 6 weeks? No °2.Does the patient have a fever or cough at all? No °3.Has the patient been tested for COVID? Had a positive COVID test? No °4. Has the patient been in contact with anyone who has tested positive? No ° °

## 2019-04-02 ENCOUNTER — Encounter: Payer: Self-pay | Admitting: Plastic Surgery

## 2019-04-02 ENCOUNTER — Ambulatory Visit (INDEPENDENT_AMBULATORY_CARE_PROVIDER_SITE_OTHER): Payer: Commercial Managed Care - PPO | Admitting: Plastic Surgery

## 2019-04-02 ENCOUNTER — Other Ambulatory Visit: Payer: Self-pay

## 2019-04-02 VITALS — BP 122/75 | HR 86 | Temp 98.4°F | Ht 62.0 in | Wt 150.4 lb

## 2019-04-02 DIAGNOSIS — Z9013 Acquired absence of bilateral breasts and nipples: Secondary | ICD-10-CM

## 2019-04-02 MED ORDER — ONDANSETRON HCL 4 MG PO TABS: 4.0000 mg | ORAL_TABLET | Freq: Three times a day (TID) | ORAL | 0 refills | Status: AC | PRN

## 2019-04-02 MED ORDER — DIAZEPAM 2 MG PO TABS: 2.0000 mg | ORAL_TABLET | Freq: Two times a day (BID) | ORAL | 0 refills | Status: AC | PRN

## 2019-04-02 MED ORDER — HYDROCODONE-ACETAMINOPHEN 5-325 MG PO TABS: 1.0000 | ORAL_TABLET | Freq: Four times a day (QID) | ORAL | 0 refills | Status: DC | PRN

## 2019-04-02 MED ORDER — CEPHALEXIN 500 MG PO CAPS: 500.0000 mg | ORAL_CAPSULE | Freq: Four times a day (QID) | ORAL | 0 refills | Status: AC

## 2019-04-02 NOTE — Progress Notes (Signed)
Patient ID: Jasmine [name], female    DOB: 1965/12/12, 53 y.o.   MRN: [id number]   Chief Complaint  Patient presents with  . Pre-op Exam    (R) breast reconstruction    The patient is a 53 year old white female here for a preoperative H&P for breast reconstruction.  She had bilateral mastectomies in 2018.  She had bilateral expanders placed with ADM but had some complications on the right side and the expander was removed.  She still has the expander on the left side.  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  She is 5 feet 2 inches tall and weighs 150 pounds.  Her preoperative bra was a 36 D.  She is pleased with the size of the left breast and shape as it has relaxed nicely.  She is ready for reconstruction of the right breast.   Review of Systems  Constitutional: Negative.  Negative for activity change and appetite change.  HENT: Negative.   Eyes: Negative.   Respiratory: Negative.  Negative for chest tightness and shortness of breath.   Cardiovascular: Negative.  Negative for leg swelling.  Gastrointestinal: Negative.  Negative for abdominal pain.  Endocrine: Negative.   Genitourinary: Negative.   Musculoskeletal: Negative.   Neurological: Negative.   Psychiatric/Behavioral: Negative.     Past Medical History:  Diagnosis Date  . Anxiety   . Cancer [facility])    right breast cancer  . Depression     Past Surgical History:  Procedure Laterality Date  . ABDOMINAL HYSTERECTOMY  2008  . BREAST RECONSTRUCTION WITH PLACEMENT OF TISSUE EXPANDER AND FLEX HD (ACELLULAR HYDRATED DERMIS) Bilateral 10/01/2017   Procedure: BILATERAL BREAST RECONSTRUCTION WITH PLACEMENT OF TISSUE EXPANDER AND FLEX HD (ACELLULAR HYDRATED DERMIS);  Surgeon: Wallace Going, DO;  Location: [facility];  Service: Plastics;  Laterality: Bilateral;  . CHOLECYSTECTOMY  2008  . DEBRIDEMENT AND CLOSURE WOUND Right  11/05/2017   Procedure: DEBRIDEMENT OF RIGHT BREAST WITH CLOSURE WOUND;  Surgeon: Wallace Going, DO;  Location: [facility];  Service: Plastics;  Laterality: Right;  . DEBRIDEMENT AND CLOSURE WOUND Right 01/12/2018   Procedure: EXCISION OF RIGHT OPEN BREAST WOUND;  Surgeon: Wallace Going, DO;  Location: [facility];  Service: Plastics;  Laterality: Right;  . MASTECTOMY W/ SENTINEL NODE BIOPSY Right 10/01/2017   Procedure: RIGHT  MASTECTOMY WITH RIGHT SENTINEL LYMPH NODE BIOPSY;  Surgeon: Coralie Keens, MD;  Location: [facility];  Service: General;  Laterality: Right;  . SIMPLE MASTECTOMY WITH AXILLARY SENTINEL NODE BIOPSY Left 10/01/2017   Procedure: Left Mastectomy;  Surgeon: Coralie Keens, MD;  Location: [facility];  Service: General;  Laterality: Left;  . WISDOM TOOTH EXTRACTION  1984      Current Outpatient Medications:  .  ALPRAZolam (XANAX) 0.5 MG tablet, Take 0.5 mg by mouth at bedtime. , Disp: , Rfl:  .  amphetamine-dextroamphetamine (ADDERALL XR) 30 MG 24 hr capsule, Take 30 mg by mouth daily., Disp: , Rfl: 0 .  FLUoxetine (PROZAC) 20 MG capsule, Take 20 mg by mouth daily., Disp: , Rfl:  .  HYDROcodone-acetaminophen (NORCO/VICODIN) 5-325 MG tablet, Take 1 tablet every 6 (six) hours as needed by mouth for moderate pain., Disp: , Rfl:  .  Ibuprofen 200 MG CAPS, Take 200-400 mg by mouth every 6 (six) hours as needed (for pain or headaches). , Disp: , Rfl:  .  letrozole (FEMARA) 2.5 MG tablet, Take 2.5 mg by mouth daily., Disp: , Rfl:  .  ondansetron (ZOFRAN) 4 MG tablet, Take 4 mg by mouth every 8 (eight) hours as needed for nausea or vomiting., Disp: , Rfl:  .  promethazine (PHENERGAN) 6.25 MG/5ML syrup, TAKE 5 ML BY MOUTH EVERY 6 HOURS AS NEEDED, Disp: , Rfl:    Objective:   Vitals:   04/02/19 1042  BP: 122/75  Pulse: 86  Temp: 98.4 F (36.9 C)  SpO2: 99%    Physical Exam Vitals signs and nursing note reviewed.   Constitutional:      Appearance: Normal appearance.  HENT:     Head: Normocephalic and atraumatic.     Nose: Nose normal.     Mouth/Throat:     Mouth: Mucous membranes are moist.  Eyes:     Extraocular Movements: Extraocular movements intact.  Cardiovascular:     Rate and Rhythm: Normal rate.     Pulses: Normal pulses.  Pulmonary:     Effort: Pulmonary effort is normal.  Abdominal:     General: Abdomen is flat. There is no distension.     Tenderness: There is no abdominal tenderness.  Skin:    General: Skin is warm.  Neurological:     General: No focal deficit present.     Mental Status: She is alert and oriented to person, place, and time.  Psychiatric:        Mood and Affect: Mood normal.        Behavior: Behavior normal.     Assessment & Plan:  Acquired absence of breast and absent nipple, bilateral  S/P mastectomy, bilateral  The risks that can be encountered with and after breast reconstruction with a muscle flap.  These include the following but not limited to these: bleeding, infection, delayed healing, anesthesia risks, skin sensation changes, injury to structures including nerves, blood vessels, and muscles which may be temporary or permanent, allergies to tape, suture materials and glues, blood products, topical preparations or injected agents, skin contour irregularities, skin discoloration and swelling, deep vein thrombosis, cardiac and pulmonary complications, pain, which may persist, fluid accumulation, wrinkling of the skin over the expander, changes in nipple or breast sensation, expander leakage or rupture, faulty position of the expander, persistent pain, formation of tight scar tissue around the expander (capsular contracture), possible need for revisional surgery or staged procedures.  Pictures were taken with the patient permission and placed in the chart We are planning on a right latissimus muscle flap with expander placement. Prescription prescription  sent to pharmacy.  I have also asked that she not smoke at all between now and surgery. [place], DO

## 2019-04-02 NOTE — H&P (View-Only) (Signed)
Patient ID: Jasmine [name], female    DOB: 06-Apr-1966, 53 y.o.   MRN: [id number]   Chief Complaint  Patient presents with  . Pre-op Exam    (R) breast reconstruction    The patient is a 53 year old white female here for a preoperative H&P for breast reconstruction.  She had bilateral mastectomies in 2018.  She had bilateral expanders placed with ADM but had some complications on the right side and the expander was removed.  She still has the expander on the left side.  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  She is 5 feet 2 inches tall and weighs 150 pounds.  Her preoperative bra was a 36 D.  She is pleased with the size of the left breast and shape as it has relaxed nicely.  She is ready for reconstruction of the right breast.   Review of Systems  Constitutional: Negative.  Negative for activity change and appetite change.  HENT: Negative.   Eyes: Negative.   Respiratory: Negative.  Negative for chest tightness and shortness of breath.   Cardiovascular: Negative.  Negative for leg swelling.  Gastrointestinal: Negative.  Negative for abdominal pain.  Endocrine: Negative.   Genitourinary: Negative.   Musculoskeletal: Negative.   Neurological: Negative.   Psychiatric/Behavioral: Negative.     Past Medical History:  Diagnosis Date  . Anxiety   . Cancer [facility])    right breast cancer  . Depression     Past Surgical History:  Procedure Laterality Date  . ABDOMINAL HYSTERECTOMY  2008  . BREAST RECONSTRUCTION WITH PLACEMENT OF TISSUE EXPANDER AND FLEX HD (ACELLULAR HYDRATED DERMIS) Bilateral 10/01/2017   Procedure: BILATERAL BREAST RECONSTRUCTION WITH PLACEMENT OF TISSUE EXPANDER AND FLEX HD (ACELLULAR HYDRATED DERMIS);  Surgeon: Wallace Going, DO;  Location: [place];  Service: Plastics;  Laterality: Bilateral;  . CHOLECYSTECTOMY  2008  . DEBRIDEMENT AND CLOSURE WOUND Right  11/05/2017   Procedure: DEBRIDEMENT OF RIGHT BREAST WITH CLOSURE WOUND;  Surgeon: Wallace Going, DO;  Location: [place];  Service: Plastics;  Laterality: Right;  . DEBRIDEMENT AND CLOSURE WOUND Right 01/12/2018   Procedure: EXCISION OF RIGHT OPEN BREAST WOUND;  Surgeon: Wallace Going, DO;  Location: [place];  Service: Plastics;  Laterality: Right;  . MASTECTOMY W/ SENTINEL NODE BIOPSY Right 10/01/2017   Procedure: RIGHT  MASTECTOMY WITH RIGHT SENTINEL LYMPH NODE BIOPSY;  Surgeon: Coralie Keens, MD;  Location: [place];  Service: General;  Laterality: Right;  . SIMPLE MASTECTOMY WITH AXILLARY SENTINEL NODE BIOPSY Left 10/01/2017   Procedure: Left Mastectomy;  Surgeon: Coralie Keens, MD;  Location: [place];  Service: General;  Laterality: Left;  . WISDOM TOOTH EXTRACTION  1984      Current Outpatient Medications:  .  ALPRAZolam (XANAX) 0.5 MG tablet, Take 0.5 mg by mouth at bedtime. , Disp: , Rfl:  .  amphetamine-dextroamphetamine (ADDERALL XR) 30 MG 24 hr capsule, Take 30 mg by mouth daily., Disp: , Rfl: 0 .  FLUoxetine (PROZAC) 20 MG capsule, Take 20 mg by mouth daily., Disp: , Rfl:  .  HYDROcodone-acetaminophen (NORCO/VICODIN) 5-325 MG tablet, Take 1 tablet every 6 (six) hours as needed by mouth for moderate pain., Disp: , Rfl:  .  Ibuprofen 200 MG CAPS, Take 200-400 mg by mouth every 6 (six) hours as needed (for pain or headaches). , Disp: , Rfl:  .  letrozole (FEMARA) 2.5 MG tablet, Take 2.5 mg by mouth daily., Disp: , Rfl:  .  ondansetron (ZOFRAN) 4 MG tablet, Take 4 mg by mouth every 8 (eight) hours as needed for nausea or vomiting., Disp: , Rfl:  .  promethazine (PHENERGAN) 6.25 MG/5ML syrup, TAKE 5 ML BY MOUTH EVERY 6 HOURS AS NEEDED, Disp: , Rfl:    Objective:   Vitals:   04/02/19 1042  BP: 122/75  Pulse: 86  Temp: 98.4 F (36.9 C)  SpO2: 99%    Physical Exam Vitals signs and nursing note reviewed.   Constitutional:      Appearance: Normal appearance.  HENT:     Head: Normocephalic and atraumatic.     Nose: Nose normal.     Mouth/Throat:     Mouth: Mucous membranes are moist.  Eyes:     Extraocular Movements: Extraocular movements intact.  Cardiovascular:     Rate and Rhythm: Normal rate.     Pulses: Normal pulses.  Pulmonary:     Effort: Pulmonary effort is normal.  Abdominal:     General: Abdomen is flat. There is no distension.     Tenderness: There is no abdominal tenderness.  Skin:    General: Skin is warm.  Neurological:     General: No focal deficit present.     Mental Status: She is alert and oriented to person, place, and time.  Psychiatric:        Mood and Affect: Mood normal.        Behavior: Behavior normal.     Assessment & Plan:  Acquired absence of breast and absent nipple, bilateral  S/P mastectomy, bilateral  The risks that can be encountered with and after breast reconstruction with a muscle flap.  These include the following but not limited to these: bleeding, infection, delayed healing, anesthesia risks, skin sensation changes, injury to structures including nerves, blood vessels, and muscles which may be temporary or permanent, allergies to tape, suture materials and glues, blood products, topical preparations or injected agents, skin contour irregularities, skin discoloration and swelling, deep vein thrombosis, cardiac and pulmonary complications, pain, which may persist, fluid accumulation, wrinkling of the skin over the expander, changes in nipple or breast sensation, expander leakage or rupture, faulty position of the expander, persistent pain, formation of tight scar tissue around the expander (capsular contracture), possible need for revisional surgery or staged procedures.  Pictures were taken with the patient permission and placed in the chart We are planning on a right latissimus muscle flap with expander placement. Prescription prescription  sent to pharmacy.  I have also asked that she not smoke at all between now and surgery. [place], DO

## 2019-04-08 NOTE — Pre-Procedure Instructions (Addendum)
Jasmine [name]  04/08/2019    Your procedure is scheduled on Tuesday, Apr 13, 2019 at 12 noon.   Report to [facility] Entrance "A" Admitting Office at 10:00 AM.   Call this number if you have problems the morning of surgery: [phone]   Questions prior to day of surgery, please call [phone] between 8 & 4 PM.   Remember:  Do not eat or drink after midnight Monday, 04/12/2019.  Take these medicines the morning of surgery with A SIP OF WATER: Fluoxetine (Prozac), Letrozole (Femara), Alprazolam (Xanax) - if needed.    Do not wear jewelry, make-up or nail polish.  Do not wear lotions, powders, perfumes or deodorant.  Do not shave 48 hours prior to surgery.    Do not bring valuables to the hospital.  [facility] is not responsible for any belongings or valuables.  Contacts, dentures or bridgework may not be worn into surgery.  Leave your suitcase in the car.  After surgery it may be brought to your room.  For patients admitted to the hospital, discharge time will be determined by your treatment team.  [facility] - Preparing for Surgery  Before surgery, you can play an important role.  Because skin is not sterile, your skin needs to be as free of germs as possible.  You can reduce the number of germs on you skin by washing with CHG (chlorahexidine gluconate) soap before surgery.  CHG is an antiseptic cleaner which kills germs and bonds with the skin to continue killing germs even after washing.  Oral Hygiene is also important in reducing the risk of infection.  Remember to brush your teeth with your regular toothpaste the morning of surgery.  Please DO NOT use if you have an allergy to CHG or antibacterial soaps.  If your skin becomes reddened/irritated stop using the CHG and inform your nurse when you arrive at Short Stay.  Do not shave (including legs and underarms) for at least 48 hours prior to the first CHG shower.  You may shave your face.  Please follow these  instructions carefully:   1.  Shower with CHG Soap the night before surgery and the morning of Surgery.  2.  If you choose to wash your hair, wash your hair first as usual with your normal shampoo.  3.  After you shampoo, rinse your hair and body thoroughly to remove the shampoo. 4.  Use CHG as you would any other liquid soap.  You can apply chg directly to the skin and wash gently with a      scrungie or washcloth.           5.  Apply the CHG Soap to your body ONLY FROM THE NECK DOWN.   Do not use on open wounds or open sores. Avoid contact with your eyes, ears, mouth and genitals (private parts).  Wash genitals (private parts) with your normal soap.  6.  Wash thoroughly, paying special attention to the area where your surgery will be performed.  7.  Thoroughly rinse your body with warm water from the neck down.  8.  DO NOT shower/wash with your normal soap after using and rinsing off the CHG Soap.  9.  Pat yourself dry with a clean towel.            10.  Wear clean pajamas.            11.  Place clean sheets on your bed the night of your  first shower and do not sleep with pets.  Day of Surgery  Shower as above.  Do not apply any lotions/deodorants the morning of surgery.   Please wear clean clothes to the hospital. Remember to brush your teeth with toothpaste.   Please read over the fact sheets that you were given.

## 2019-04-08 NOTE — Pre-Procedure Instructions (Signed)
Jasmine [name]  04/08/2019      [facility] - [place], [place] - [address] [place] [address] Phone: [phone] Fax: [phone]    Your procedure is scheduled on Apr 13, 2019  Report to [place] at 1000 A.M.  Call this number if you have problems the morning of surgery:  [phone]   Remember:  Do not eat or drink after midnight.    Take these medicines the morning of surgery with A SIP OF WATER   Alprazolam (Xanax) if needed Fluoxetine (Prozac) Letrozole (Femara) Promethazine (Phenergan) if needed   7 days prior to surgery STOP taking any Aspirin (unless otherwise instructed by your surgeon), Aleve, Naproxen, Ibuprofen, Motrin, Advil, Goody's, BC's, all herbal medications, fish oil, and all vitamins.   Do not wear jewelry, make-up or nail polish.  Do not wear lotions, powders, or perfumes, or deodorant.  Do not shave 48 hours prior to surgery.  Do not bring valuables to the hospital.  [facility] is not responsible for any belongings or valuables.  Contacts, dentures or bridgework may not be worn into surgery.  Leave your suitcase in the car.  After surgery it may be brought to your room.   [place]- Preparing For Surgery  Before surgery, you can play an important role. Because skin is not sterile, your skin needs to be as free of germs as possible. You can reduce the number of germs on your skin by washing with CHG (chlorahexidine gluconate) Soap before surgery.  CHG is an antiseptic cleaner which kills germs and bonds with the skin to continue killing germs even after washing.    Oral Hygiene is also important to reduce your risk of infection.  Remember - BRUSH YOUR TEETH THE MORNING OF SURGERY WITH YOUR REGULAR TOOTHPASTE  Please do not use if you have an allergy to CHG or antibacterial soaps. If your skin becomes reddened/irritated stop using the CHG.  Do not shave (including legs and underarms)  for at least 48 hours prior to first CHG shower. It is OK to shave your face.  Please follow these instructions carefully.   1. Shower the NIGHT BEFORE SURGERY and the MORNING OF SURGERY with CHG.   2. If you chose to wash your hair, wash your hair first as usual with your normal shampoo.  3. After you shampoo, rinse your hair and body thoroughly to remove the shampoo.  4. Use CHG as you would any other liquid soap. You can apply CHG directly to the skin and wash gently with a scrungie or a clean washcloth.   5. Apply the CHG Soap to your body ONLY FROM THE NECK DOWN.  Do not use on open wounds or open sores. Avoid contact with your eyes, ears, mouth and genitals (private parts). Wash Face and genitals (private parts)  with your normal soap.  6. Wash thoroughly, paying special attention to the area where your surgery will be performed.  7. Thoroughly rinse your body with warm water from the neck down.  8. DO NOT shower/wash with your normal soap after using and rinsing off the CHG Soap.  9. Pat yourself dry with a CLEAN TOWEL.  10. Wear CLEAN PAJAMAS to bed the night before surgery, wear comfortable clothes the morning of surgery  11. Place CLEAN SHEETS on your bed the night of your first shower and DO NOT SLEEP WITH PETS.    Day of Surgery:  Do not apply any deodorants/lotions.  Please wear clean clothes to the hospital/surgery center.   Remember to brush your teeth WITH YOUR REGULAR TOOTHPASTE.  For patients admitted to the hospital, discharge time will be determined by your treatment team.  Patients discharged the day of surgery will not be allowed to drive home.

## 2019-04-09 ENCOUNTER — Other Ambulatory Visit (HOSPITAL_COMMUNITY)
Admission: RE | Admit: 2019-04-09 | Discharge: 2019-04-09 | Disposition: A | Discharge status: Home / Self Care | Payer: Commercial Managed Care - PPO | Source: Ambulatory Visit | Attending: Plastic Surgery | Admitting: Plastic Surgery

## 2019-04-09 ENCOUNTER — Encounter (HOSPITAL_COMMUNITY)
Admission: RE | Admit: 2019-04-09 | Discharge: 2019-04-09 | Disposition: A | Discharge status: Home / Self Care | Payer: Commercial Managed Care - PPO | Source: Ambulatory Visit | Attending: Plastic Surgery | Admitting: Plastic Surgery

## 2019-04-09 ENCOUNTER — Encounter (HOSPITAL_COMMUNITY): Payer: Self-pay

## 2019-04-09 ENCOUNTER — Other Ambulatory Visit: Payer: Self-pay

## 2019-04-09 DIAGNOSIS — Z1159 Encounter for screening for other viral diseases: Secondary | ICD-10-CM | POA: Diagnosis not present

## 2019-04-09 DIAGNOSIS — Z01812 Encounter for preprocedural laboratory examination: Secondary | ICD-10-CM | POA: Diagnosis present

## 2019-04-09 HISTORY — DX: Personal history of urinary calculi: Z87.442

## 2019-04-09 HISTORY — DX: Pneumonia, unspecified organism: J18.9

## 2019-04-09 LAB — BASIC METABOLIC PANEL
Anion gap: 8 (ref 5–15)
BUN: 7 mg/dL (ref 6–20)
CO2: 26 mmol/L (ref 22–32)
Calcium: 9.1 mg/dL (ref 8.9–10.3)
Chloride: 106 mmol/L (ref 98–111)
Creatinine, Ser: 0.71 mg/dL (ref 0.44–1.00)
GFR calc Af Amer: >60 mL/min (ref >60)
GFR calc non Af Amer: >60 mL/min (ref >60)
Glucose, Bld: 92 mg/dL (ref 70–99)
Potassium: 3.7 mmol/L (ref 3.5–5.1)
Sodium: 140 mmol/L (ref 135–145)

## 2019-04-09 LAB — CBC
HCT: 40.1 % (ref 36.0–46.0)
Hemoglobin: 13.1 g/dL (ref 12.0–15.0)
MCH: 32.9 pg (ref 26.0–34.0)
MCHC: 32.7 g/dL (ref 30.0–36.0)
MCV: 100.8 fL — ABNORMAL HIGH (ref 80.0–100.0)
Platelets: 347 10*3/uL (ref 150–400)
RBC: 3.98 MIL/uL (ref 3.87–5.11)
RDW: 13.6 % (ref 11.5–15.5)
WBC: 10.9 10*3/uL — ABNORMAL HIGH (ref 4.0–10.5)
nRBC: 0 % (ref 0.0–0.2)

## 2019-04-09 NOTE — Progress Notes (Signed)
PCP - Dr. Nelda Bucks Cardiologist - N/a  Chest x-ray - n/a EKG - n/a Stress Test - n/a ECHO - n/a Cardiac Cath - n/a  Sleep Study - n/a CPAP - n/a  Fasting Blood Sugar - n/a Checks Blood Sugar _____ times a day  Blood Thinner Instructions: n/a Aspirin Instructions: n/a  Anesthesia review: no  Patient denies shortness of breath, fever, cough and chest pain at PAT appointment   Patient verbalized understanding of instructions that were given to them at the PAT appointment. Patient was also instructed that they will need to review over the PAT instructions again at home before surgery.  Pt has a Covid 19 testing appt today. Pt informed that she will need to quarantine until day of surgery. Go home and stay home. Pt voiced understanding.   Coronavirus Screening  Have you experienced the following symptoms:  Cough NO Fever (>100.89F)  NO Runny nose NO Sore throat NO Difficulty breathing/shortness of breath  NO  Have you or a family member traveled in the last 14 days and where? NO   If the patient indicates "YES" to the above questions, their PAT will be rescheduled to limit the exposure to others and, the surgeon will be notified. THE PATIENT WILL NEED TO BE ASYMPTOMATIC FOR 14 DAYS.   If the patient is not experiencing any of these symptoms, the PAT nurse will instruct them to NOT bring anyone with them to their appointment since they may have these symptoms or traveled as well.   Patient reminded that hospital visitation restrictions are in effect and the importance of the restrictions.

## 2019-04-10 LAB — NOVEL CORONAVIRUS, NAA (HOSP ORDER, SEND-OUT TO REF LAB; TAT 18-24 HRS): SARS-CoV-2, NAA: NOT DETECTED

## 2019-04-13 ENCOUNTER — Inpatient Hospital Stay (HOSPITAL_COMMUNITY): Payer: Commercial Managed Care - PPO | Admitting: Anesthesiology

## 2019-04-13 ENCOUNTER — Inpatient Hospital Stay (HOSPITAL_COMMUNITY): Payer: Commercial Managed Care - PPO | Admitting: Vascular Surgery

## 2019-04-13 ENCOUNTER — Inpatient Hospital Stay (HOSPITAL_COMMUNITY)
Admission: RE | Admit: 2019-04-13 | Discharge: 2019-04-14 | DRG: 941 | Disposition: A | Discharge status: Home / Self Care | Payer: Commercial Managed Care - PPO | Attending: Plastic Surgery | Admitting: Plastic Surgery

## 2019-04-13 ENCOUNTER — Other Ambulatory Visit: Payer: Self-pay

## 2019-04-13 ENCOUNTER — Encounter (HOSPITAL_COMMUNITY): Payer: Self-pay | Admitting: *Deleted

## 2019-04-13 ENCOUNTER — Encounter (HOSPITAL_COMMUNITY)
Admission: RE | Disposition: A | Discharge status: Home / Self Care | Payer: Self-pay | Source: Home / Self Care | Attending: Plastic Surgery

## 2019-04-13 DIAGNOSIS — F419 Anxiety disorder, unspecified: Secondary | ICD-10-CM | POA: Diagnosis present

## 2019-04-13 DIAGNOSIS — Z9049 Acquired absence of other specified parts of digestive tract: Secondary | ICD-10-CM | POA: Diagnosis not present

## 2019-04-13 DIAGNOSIS — Z9011 Acquired absence of right breast and nipple: Secondary | ICD-10-CM | POA: Diagnosis not present

## 2019-04-13 DIAGNOSIS — Z853 Personal history of malignant neoplasm of breast: Secondary | ICD-10-CM

## 2019-04-13 DIAGNOSIS — Z9013 Acquired absence of bilateral breasts and nipples: Secondary | ICD-10-CM | POA: Diagnosis present

## 2019-04-13 DIAGNOSIS — Z90710 Acquired absence of both cervix and uterus: Secondary | ICD-10-CM | POA: Diagnosis not present

## 2019-04-13 DIAGNOSIS — Z79899 Other long term (current) drug therapy: Secondary | ICD-10-CM | POA: Diagnosis not present

## 2019-04-13 DIAGNOSIS — Z79891 Long term (current) use of opiate analgesic: Secondary | ICD-10-CM | POA: Diagnosis not present

## 2019-04-13 DIAGNOSIS — Z791 Long term (current) use of non-steroidal anti-inflammatories (NSAID): Secondary | ICD-10-CM

## 2019-04-13 DIAGNOSIS — Z79811 Long term (current) use of aromatase inhibitors: Secondary | ICD-10-CM | POA: Diagnosis not present

## 2019-04-13 DIAGNOSIS — Z1501 Genetic susceptibility to malignant neoplasm of breast: Secondary | ICD-10-CM

## 2019-04-13 HISTORY — PX: LATISSIMUS FLAP TO BREAST: SHX5357

## 2019-04-13 SURGERY — RECONSTRUCTION, BREAST, USING LATISSIMUS DORSI MYOCUTANEOUS FLAP
Anesthesia: General | Site: Breast | Laterality: Right

## 2019-04-13 MED ORDER — SODIUM CHLORIDE 0.9 % IV SOLN
INTRAVENOUS | Status: AC
  Filled 2019-04-13: qty 500000

## 2019-04-13 MED ORDER — KCL IN DEXTROSE-NACL 20-5-0.45 MEQ/L-%-% IV SOLN
INTRAVENOUS | Status: DC
  Administered 2019-04-13 – 2019-04-14 (×2): via INTRAVENOUS
  Filled 2019-04-13 (×2): qty 1000

## 2019-04-13 MED ORDER — ROCURONIUM BROMIDE 10 MG/ML (PF) SYRINGE
PREFILLED_SYRINGE | INTRAVENOUS | Status: AC
  Filled 2019-04-13: qty 10

## 2019-04-13 MED ORDER — ONDANSETRON 4 MG PO TBDP: 4.0000 mg | ORAL_TABLET | Freq: Four times a day (QID) | ORAL | Status: DC | PRN

## 2019-04-13 MED ORDER — SUGAMMADEX SODIUM 200 MG/2ML IV SOLN
INTRAVENOUS | Status: DC | PRN
  Administered 2019-04-13: 130 mg via INTRAVENOUS

## 2019-04-13 MED ORDER — OXYCODONE HCL 5 MG/5ML PO SOLN: 5.0000 mg | Freq: Once | ORAL | Status: DC | PRN

## 2019-04-13 MED ORDER — PROPOFOL 10 MG/ML IV BOLUS
INTRAVENOUS | Status: DC | PRN
  Administered 2019-04-13: 150 mg via INTRAVENOUS

## 2019-04-13 MED ORDER — ONDANSETRON HCL 4 MG/2ML IJ SOLN
INTRAMUSCULAR | Status: AC
  Filled 2019-04-13: qty 2

## 2019-04-13 MED ORDER — PROMETHAZINE HCL 25 MG/ML IJ SOLN: 6.2500 mg | INTRAMUSCULAR | Status: DC | PRN

## 2019-04-13 MED ORDER — HYDROCODONE-ACETAMINOPHEN 5-325 MG PO TABS
ORAL_TABLET | ORAL | Status: AC
  Filled 2019-04-13: qty 1

## 2019-04-13 MED ORDER — EVICEL 5 ML EX KIT
PACK | CUTANEOUS | Status: DC | PRN
  Administered 2019-04-13: 5 mL

## 2019-04-13 MED ORDER — FENTANYL CITRATE (PF) 250 MCG/5ML IJ SOLN
INTRAMUSCULAR | Status: AC
  Filled 2019-04-13: qty 5

## 2019-04-13 MED ORDER — HYDROMORPHONE HCL 1 MG/ML IJ SOLN
1.0000 mg | INTRAMUSCULAR | Status: DC | PRN
  Administered 2019-04-13: 1 mg via INTRAVENOUS
  Filled 2019-04-13: qty 1

## 2019-04-13 MED ORDER — ONDANSETRON HCL 4 MG/2ML IJ SOLN
INTRAMUSCULAR | Status: AC
  Filled 2019-04-13: qty 4

## 2019-04-13 MED ORDER — HYDROCODONE-ACETAMINOPHEN 5-325 MG PO TABS
1.0000 | ORAL_TABLET | ORAL | Status: DC | PRN
  Administered 2019-04-13 – 2019-04-14 (×3): 2 via ORAL
  Filled 2019-04-13 (×2): qty 2

## 2019-04-13 MED ORDER — CHLORHEXIDINE GLUCONATE 4 % EX LIQD: 1.0000 "application " | Freq: Once | CUTANEOUS | Status: DC

## 2019-04-13 MED ORDER — LACTATED RINGERS IV SOLN
INTRAVENOUS | Status: DC
  Administered 2019-04-13 (×2): via INTRAVENOUS

## 2019-04-13 MED ORDER — LIDOCAINE 2% (20 MG/ML) 5 ML SYRINGE
INTRAMUSCULAR | Status: DC | PRN
  Administered 2019-04-13: 80 mg via INTRAVENOUS

## 2019-04-13 MED ORDER — MIDAZOLAM HCL 2 MG/2ML IJ SOLN
INTRAMUSCULAR | Status: AC
  Filled 2019-04-13: qty 2

## 2019-04-13 MED ORDER — DIAZEPAM 2 MG PO TABS: 2.0000 mg | ORAL_TABLET | Freq: Two times a day (BID) | ORAL | Status: DC | PRN

## 2019-04-13 MED ORDER — CEFAZOLIN SODIUM-DEXTROSE 2-4 GM/100ML-% IV SOLN
INTRAVENOUS | Status: AC
  Filled 2019-04-13: qty 100

## 2019-04-13 MED ORDER — DIPHENHYDRAMINE HCL 12.5 MG/5ML PO ELIX: 12.5000 mg | ORAL_SOLUTION | Freq: Four times a day (QID) | ORAL | Status: DC | PRN

## 2019-04-13 MED ORDER — CEFAZOLIN SODIUM-DEXTROSE 1-4 GM/50ML-% IV SOLN
1.0000 g | Freq: Three times a day (TID) | INTRAVENOUS | Status: DC
  Administered 2019-04-13 – 2019-04-14 (×2): 1 g via INTRAVENOUS
  Filled 2019-04-13 (×3): qty 50

## 2019-04-13 MED ORDER — DEXAMETHASONE SODIUM PHOSPHATE 10 MG/ML IJ SOLN
INTRAMUSCULAR | Status: DC | PRN
  Administered 2019-04-13: 10 mg via INTRAVENOUS

## 2019-04-13 MED ORDER — CEFAZOLIN SODIUM-DEXTROSE 2-4 GM/100ML-% IV SOLN
2.0000 g | INTRAVENOUS | Status: AC
  Administered 2019-04-13: 2 g via INTRAVENOUS

## 2019-04-13 MED ORDER — ONDANSETRON HCL 4 MG/2ML IJ SOLN: 4.0000 mg | Freq: Four times a day (QID) | INTRAMUSCULAR | Status: DC | PRN

## 2019-04-13 MED ORDER — ROCURONIUM BROMIDE 10 MG/ML (PF) SYRINGE
PREFILLED_SYRINGE | INTRAVENOUS | Status: AC
  Filled 2019-04-13: qty 20

## 2019-04-13 MED ORDER — SUCCINYLCHOLINE CHLORIDE 200 MG/10ML IV SOSY
PREFILLED_SYRINGE | INTRAVENOUS | Status: AC
  Filled 2019-04-13: qty 60

## 2019-04-13 MED ORDER — MIDAZOLAM HCL 2 MG/2ML IJ SOLN
INTRAMUSCULAR | Status: DC | PRN
  Administered 2019-04-13: 2 mg via INTRAVENOUS

## 2019-04-13 MED ORDER — PHENYLEPHRINE 40 MCG/ML (10ML) SYRINGE FOR IV PUSH (FOR BLOOD PRESSURE SUPPORT)
PREFILLED_SYRINGE | INTRAVENOUS | Status: AC
  Filled 2019-04-13: qty 10

## 2019-04-13 MED ORDER — SENNA 8.6 MG PO TABS
1.0000 | ORAL_TABLET | Freq: Two times a day (BID) | ORAL | Status: DC
  Administered 2019-04-13: 8.6 mg via ORAL
  Filled 2019-04-13: qty 1

## 2019-04-13 MED ORDER — ZOLPIDEM TARTRATE 5 MG PO TABS
5.0000 mg | ORAL_TABLET | Freq: Every evening | ORAL | Status: DC | PRN
  Filled 2019-04-13: qty 1

## 2019-04-13 MED ORDER — FENTANYL CITRATE (PF) 100 MCG/2ML IJ SOLN
INTRAMUSCULAR | Status: AC
  Filled 2019-04-13: qty 2

## 2019-04-13 MED ORDER — FENTANYL CITRATE (PF) 100 MCG/2ML IJ SOLN
25.0000 ug | INTRAMUSCULAR | Status: DC | PRN
  Administered 2019-04-13: 50 ug via INTRAVENOUS

## 2019-04-13 MED ORDER — SUCCINYLCHOLINE CHLORIDE 200 MG/10ML IV SOSY
PREFILLED_SYRINGE | INTRAVENOUS | Status: DC | PRN
  Administered 2019-04-13: 100 mg via INTRAVENOUS

## 2019-04-13 MED ORDER — SODIUM CHLORIDE 0.9 % IV SOLN
INTRAVENOUS | Status: DC | PRN
  Administered 2019-04-13: 500 mL

## 2019-04-13 MED ORDER — LIDOCAINE 2% (20 MG/ML) 5 ML SYRINGE
INTRAMUSCULAR | Status: AC
  Filled 2019-04-13: qty 15

## 2019-04-13 MED ORDER — NAPROXEN 250 MG PO TABS: 500.0000 mg | ORAL_TABLET | Freq: Two times a day (BID) | ORAL | Status: DC | PRN

## 2019-04-13 MED ORDER — ESMOLOL HCL 100 MG/10ML IV SOLN
INTRAVENOUS | Status: AC
  Filled 2019-04-13: qty 10

## 2019-04-13 MED ORDER — SCOPOLAMINE 1 MG/3DAYS TD PT72
MEDICATED_PATCH | TRANSDERMAL | Status: DC | PRN
  Administered 2019-04-13: 1 via TRANSDERMAL

## 2019-04-13 MED ORDER — ARTIFICIAL TEARS OPHTHALMIC OINT
TOPICAL_OINTMENT | OPHTHALMIC | Status: AC
  Filled 2019-04-13: qty 3.5

## 2019-04-13 MED ORDER — DEXAMETHASONE SODIUM PHOSPHATE 10 MG/ML IJ SOLN
INTRAMUSCULAR | Status: AC
  Filled 2019-04-13: qty 1

## 2019-04-13 MED ORDER — OXYCODONE HCL 5 MG PO TABS: 5.0000 mg | ORAL_TABLET | Freq: Once | ORAL | Status: DC | PRN

## 2019-04-13 MED ORDER — EVICEL 5 ML EX KIT
PACK | CUTANEOUS | Status: DC | PRN
  Administered 2019-04-13: 1

## 2019-04-13 MED ORDER — PHENYLEPHRINE 40 MCG/ML (10ML) SYRINGE FOR IV PUSH (FOR BLOOD PRESSURE SUPPORT)
PREFILLED_SYRINGE | INTRAVENOUS | Status: DC | PRN
  Administered 2019-04-13 (×3): 80 ug via INTRAVENOUS

## 2019-04-13 MED ORDER — DIPHENHYDRAMINE HCL 50 MG/ML IJ SOLN: 12.5000 mg | Freq: Four times a day (QID) | INTRAMUSCULAR | Status: DC | PRN

## 2019-04-13 MED ORDER — BUPIVACAINE-EPINEPHRINE (PF) 0.25% -1:200000 IJ SOLN
INTRAMUSCULAR | Status: AC
  Filled 2019-04-13: qty 30

## 2019-04-13 MED ORDER — LIDOCAINE 2% (20 MG/ML) 5 ML SYRINGE
INTRAMUSCULAR | Status: AC
  Filled 2019-04-13: qty 5

## 2019-04-13 MED ORDER — POLYETHYLENE GLYCOL 3350 17 G PO PACK: 17.0000 g | PACK | Freq: Every day | ORAL | Status: DC | PRN

## 2019-04-13 MED ORDER — ROCURONIUM BROMIDE 10 MG/ML (PF) SYRINGE
PREFILLED_SYRINGE | INTRAVENOUS | Status: DC | PRN
  Administered 2019-04-13: 50 mg via INTRAVENOUS
  Administered 2019-04-13 (×2): 10 mg via INTRAVENOUS
  Administered 2019-04-13: 20 mg via INTRAVENOUS

## 2019-04-13 MED ORDER — FENTANYL CITRATE (PF) 250 MCG/5ML IJ SOLN
INTRAMUSCULAR | Status: DC | PRN
  Administered 2019-04-13: 50 ug via INTRAVENOUS
  Administered 2019-04-13: 100 ug via INTRAVENOUS
  Administered 2019-04-13: 50 ug via INTRAVENOUS

## 2019-04-13 MED ORDER — ACETAMINOPHEN 500 MG PO TABS
500.0000 mg | ORAL_TABLET | Freq: Four times a day (QID) | ORAL | Status: DC
  Administered 2019-04-13 – 2019-04-14 (×2): 500 mg via ORAL
  Filled 2019-04-13 (×2): qty 1

## 2019-04-13 MED ORDER — EVICEL 5 ML EX KIT
PACK | CUTANEOUS | Status: AC
  Filled 2019-04-13: qty 1

## 2019-04-13 MED ORDER — PROPOFOL 10 MG/ML IV BOLUS
INTRAVENOUS | Status: AC
  Filled 2019-04-13: qty 20

## 2019-04-13 MED ORDER — ONDANSETRON HCL 4 MG/2ML IJ SOLN
INTRAMUSCULAR | Status: DC | PRN
  Administered 2019-04-13: 4 mg via INTRAVENOUS

## 2019-04-13 MED ORDER — ONDANSETRON HCL 4 MG/2ML IJ SOLN: INTRAMUSCULAR | Status: DC | PRN

## 2019-04-13 SURGICAL SUPPLY — 75 items
APPLIER CLIP 9.375 MED OPEN (MISCELLANEOUS) ×3
BAG DECANTER FOR FLEXI CONT (MISCELLANEOUS) ×3 IMPLANT
BINDER BREAST XLRG (GAUZE/BANDAGES/DRESSINGS) ×2 IMPLANT
BIOPATCH RED 1 DISK 7.0 (GAUZE/BANDAGES/DRESSINGS) ×7 IMPLANT
BIOPATCH RED 1IN DISK 7.0MM (GAUZE/BANDAGES/DRESSINGS) ×5
BLADE SURG 10 STRL SS (BLADE) ×3 IMPLANT
BLADE SURG 15 STRL LF DISP TIS (BLADE) ×1 IMPLANT
BLADE SURG 15 STRL SS (BLADE) ×2
CANISTER SUCT 3000ML PPV (MISCELLANEOUS) ×3 IMPLANT
CHLORAPREP W/TINT 26ML (MISCELLANEOUS) ×5 IMPLANT
CLIP APPLIE 9.375 MED OPEN (MISCELLANEOUS) ×1 IMPLANT
CLOSURE WOUND 1/2 X4 (GAUZE/BANDAGES/DRESSINGS)
CONNECTOR 5 IN 1 STRAIGHT STRL (MISCELLANEOUS) ×3 IMPLANT
COVER SURGICAL LIGHT HANDLE (MISCELLANEOUS) ×5 IMPLANT
DECANTER SPIKE VIAL GLASS SM (MISCELLANEOUS) ×3 IMPLANT
DERMABOND ADVANCED (GAUZE/BANDAGES/DRESSINGS) ×8
DERMABOND ADVANCED .7 DNX12 (GAUZE/BANDAGES/DRESSINGS) ×2 IMPLANT
DRAIN CHANNEL 19F RND (DRAIN) ×6 IMPLANT
DRAPE HALF SHEET 40X57 (DRAPES) ×8 IMPLANT
DRAPE INCISE 23X17 IOBAN STRL (DRAPES) ×2
DRAPE INCISE 23X17 STRL (DRAPES) ×1 IMPLANT
DRAPE INCISE IOBAN 23X17 STRL (DRAPES) ×1 IMPLANT
DRAPE INCISE IOBAN 85X60 (DRAPES) IMPLANT
DRAPE ORTHO SPLIT 77X108 STRL (DRAPES) ×8
DRAPE SURG ORHT 6 SPLT 77X108 (DRAPES) ×2 IMPLANT
DRAPE WARM FLUID 44X44 (DRAPE) ×5 IMPLANT
DRSG MEPILEX BORDER 4X8 (GAUZE/BANDAGES/DRESSINGS) ×3 IMPLANT
DRSG PAD ABDOMINAL 8X10 ST (GAUZE/BANDAGES/DRESSINGS) ×6 IMPLANT
ELECT BLADE 4.0 EZ CLEAN MEGAD (MISCELLANEOUS) ×3
ELECT BLADE 6.5 EXT (BLADE) ×2 IMPLANT
ELECT CAUTERY BLADE 6.4 (BLADE) ×3 IMPLANT
ELECT REM PT RETURN 9FT ADLT (ELECTROSURGICAL) ×3
ELECTRODE BLDE 4.0 EZ CLN MEGD (MISCELLANEOUS) ×1 IMPLANT
ELECTRODE REM PT RTRN 9FT ADLT (ELECTROSURGICAL) ×1 IMPLANT
EVACUATOR SILICONE 100CC (DRAIN) ×6 IMPLANT
GAUZE SPONGE 4X4 12PLY STRL (GAUZE/BANDAGES/DRESSINGS) ×4 IMPLANT
GLOVE BIO SURGEON STRL SZ 6.5 (GLOVE) ×8 IMPLANT
GLOVE BIO SURGEON STRL SZ7 (GLOVE) ×4 IMPLANT
GLOVE BIO SURGEONS STRL SZ 6.5 (GLOVE) ×6
GLOVE BIOGEL PI IND STRL 6.5 (GLOVE) IMPLANT
GLOVE BIOGEL PI INDICATOR 6.5 (GLOVE) ×8
GLOVE SURG SS PI 6.5 STRL IVOR (GLOVE) ×4 IMPLANT
GLOVE SURG SS PI 7.0 STRL IVOR (GLOVE) ×4 IMPLANT
GOWN STRL REUS W/ TWL LRG LVL3 (GOWN DISPOSABLE) ×2 IMPLANT
GOWN STRL REUS W/TWL LRG LVL3 (GOWN DISPOSABLE) ×14
IMPL EXPANDER BREAST 375CC (Breast) IMPLANT
IMPLANT BREAST 375CC (Breast) ×2 IMPLANT
IMPLANT EXPANDER BREAST 375CC (Breast) ×1 IMPLANT
KIT BASIN OR (CUSTOM PROCEDURE TRAY) ×3 IMPLANT
NDL HYPO 25GX1X1/2 BEV (NEEDLE) ×1 IMPLANT
NEEDLE HYPO 25GX1X1/2 BEV (NEEDLE) ×3 IMPLANT
NS IRRIG 1000ML POUR BTL (IV SOLUTION) ×6 IMPLANT
PACK GENERAL/GYN (CUSTOM PROCEDURE TRAY) ×3 IMPLANT
PAD ARMBOARD 7.5X6 YLW CONV (MISCELLANEOUS) ×11 IMPLANT
PENCIL BUTTON HOLSTER BLD 10FT (ELECTRODE) ×3 IMPLANT
PIN SAFETY STERILE (MISCELLANEOUS) ×6 IMPLANT
SET ASEPTIC TRANSFER (MISCELLANEOUS) ×2 IMPLANT
SLEEVE SUCTION 125 (MISCELLANEOUS) ×2 IMPLANT
SPONGE LAP 18X18 RF (DISPOSABLE) ×2 IMPLANT
STAPLER VISISTAT 35W (STAPLE) ×3 IMPLANT
STRIP CLOSURE SKIN 1/2X4 (GAUZE/BANDAGES/DRESSINGS) IMPLANT
SUT MNCRL AB 3-0 PS2 18 (SUTURE) ×10 IMPLANT
SUT MNCRL AB 4-0 PS2 18 (SUTURE) ×10 IMPLANT
SUT MON AB 5-0 PS2 18 (SUTURE) ×12 IMPLANT
SUT SILK 3 0 FS 1X18 (SUTURE) ×10 IMPLANT
SUT VIC AB 3-0 SH 27 (SUTURE) ×16
SUT VIC AB 3-0 SH 27X BRD (SUTURE) IMPLANT
SYR BULB IRRIGATION 50ML (SYRINGE) ×9 IMPLANT
SYR CONTROL 10ML LL (SYRINGE) ×3 IMPLANT
TOWEL GREEN STERILE (TOWEL DISPOSABLE) ×3 IMPLANT
TOWEL GREEN STERILE FF (TOWEL DISPOSABLE) ×3 IMPLANT
TRAY FOLEY MTR SLVR 14FR STAT (SET/KITS/TRAYS/PACK) IMPLANT
TUBE CONNECTING 12'X1/4 (SUCTIONS) ×1
TUBE CONNECTING 12X1/4 (SUCTIONS) ×2 IMPLANT
YANKAUER SUCT BULB TIP NO VENT (SUCTIONS) ×3 IMPLANT

## 2019-04-13 NOTE — Op Note (Signed)
DATE OF OPERATION: 04/13/2019  LOCATION: [facility] Inpatient  PREOPERATIVE DIAGNOSIS:  1. Breast Cancer Status post right mastectomy. 2.   Acquired absence of right Breast.  POSTOPERATIVE DIAGNOSIS: Same  PROCEDURE: 1. Latissimus myocutaneous flap  to reconstruct the right breast CPT 19361 2. Tissue expander placement with 150 cc injectable saline placed in the 375 cc expancder CPT 19357  SURGEON: Lyndee Leo Sanger Derril Franek, DO  ASSISTANT: Bonita Cox, RNFA  EBL: 150 cc  SPECIMEN: None  DRAINS: 3 total 87 blake round drains  CONDITION: Stable  COMPLICATIONS: None  INDICATION: The patient, Jasmine [name], is a 53 y.o. female born on [birth year], is here for treatment after a right mastectomy with resulting absence of breast and asymmetry.  She had an expander placed but had complications including exposure and pneumonia.  The expander was removed and she had severe tightening of the skin.  The decision was made for a muscle flap to gain the healthy tissue needed for the reconstruction.  PROCEDURE DETAILS:  The patient was seen on the morning of her surgery and marked for the location of the flap.  An IV was placed and IV antibiotics were given. The patient was taken to the operating room and placed on the operating room table.  A general anesthetic was administered.  A standard time out was performed and all information was confirmed to be correct by those in the room. Leg compression devices were placed on her legs.  The patient was placed in the left lateral decubitus position on a beanbag.  All key prominent points were padded and an axillary roll was placed in the dependent axilla. The ipsilateral arm was placed on a padded rest anteriosuperiorly.  She was then prepped and draped in the standard sterile fashion. The paddle design and position were confirmed.   The procedure began by incising the skin at the marked skin paddle.  The #10 blade and bovie were used to dissect  down to the latissimus muscle.  Anterior and posterior flaps were raised to expose the latissimus muscle edges.  The skin and fat flaps were elevated to the extent necessary for release of the muscle.  The muscle was released at the superior edge of the inferior angle of the scapula.  This aids in identification of the serratus muscle to prevent lifting it with the latissimus muscle.  The larger caliber perforator were clipped and the smaller vessels controlled with electrocautery.  The dissection continued toward the midline and inferior toward the iliac crest.  The subscapular artery to the thoracodorsal artery was palpated in the axilla.  The branch to the serratus is ligated.  Care was taken to protect the vascular pedicle throughout this portion of the procedure. The paddle and muscle looked healthy throughout the case.  The flap was rotated into the breast pocket.  The posterior and anterior pockets were then connected in the plane below the pectoralis  Muscle due to her previous surgery. The muscle from the back and the skin paddle were then rotated into the chest pocket. The back pocket was hemostased and Evicel placed. Two #19 blake round drains were place in the anterior skin flap and secured with 3-0 Silk. One drain was positioned inferiorly and one superiorly.  The back incision was closed in layers with buried 3-0 Monocryl, followed by 4-0 Monocryl and 5-0 Monocryl.  Dermabond and a protective dressing was applied. The patient was repositioned onto her back and the chest was prepped and draped.  The old  mastectomy scar was excised and the flaps on top of the pectoralis muscle were raised.  The breast pocket was inspected and hemostases was achieved with electrocautery. The muscle was then secured superiorly to the chest wall with 3-0 Monocryl. A 375 cc expander was chosen. It was soaked in triple antibiotic solution and evacuated of air and then filled with 150 cc of sterile saline. The inferior  portion of the muscle was tacked to the inframammary fold with 3-0 Monocryl.  One drain was placed and secured with 3-0 Silk. The flap was then closed with 4-0 Monocryl deep, followed by 5-0 Monocryl to close the skin.  Dermabond, ABDs and a breast binder was applied.    The patient was allowed to wake up and taken to recovery room in stable condition at the end of the case. The family was notified at the end of the case.   The RNFA assisted throughout the case.  The RNFA was essential in retraction and counter traction when needed to make the case progress smoothly.  This retraction and assistance made it possible to see the tissue plans for the procedure.  The assistance was needed for blood control, tissue re-approximation and assisted with closure of the incision site.

## 2019-04-13 NOTE — Transfer of Care (Signed)
Immediate Anesthesia Transfer of Care Note  Patient: Jasmine [name]  Procedure(s) Performed: Right breast reconstruction with latissimus muscle flap and expander placement (Right Breast)  Patient Location: PACU  Anesthesia Type:General  Level of Consciousness: drowsy  Airway & Oxygen Therapy: Patient Spontanous Breathing and Patient connected to face mask oxygen  Post-op Assessment: Report given to RN  Post vital signs: Reviewed and stable  Last Vitals:  Vitals Value Taken Time  BP    Temp    Pulse 86 04/13/2019  4:50 PM  Resp 17 04/13/2019  4:50 PM  SpO2 99 % 04/13/2019  4:50 PM  Vitals shown include unvalidated device data.  Last Pain:  Vitals:   04/13/19 1157  TempSrc:   PainSc: 0-No pain      Patients Stated Pain Goal: 3 (69/67/89 3810)  Complications: No apparent anesthesia complications

## 2019-04-13 NOTE — Anesthesia Preprocedure Evaluation (Addendum)
Anesthesia Evaluation  Patient identified by MRN, date of birth, ID band Patient awake    Reviewed: Allergy & Precautions, NPO status , Patient's Chart, lab work & pertinent test results  History of Anesthesia Complications Negative for: history of anesthetic complications  Airway Mallampati: II  TM Distance: >3 FB Neck ROM: Full    Dental  (+) Dental Advisory Given, Partial Upper   Pulmonary Current Smoker,    breath sounds clear to auscultation       Cardiovascular negative cardio ROS   Rhythm:Regular Rate:Normal     Neuro/Psych PSYCHIATRIC DISORDERS Anxiety Depression negative neurological ROS     GI/Hepatic negative GI ROS, Neg liver ROS,   Endo/Other  negative endocrine ROS  Renal/GU negative Renal ROS     Musculoskeletal negative musculoskeletal ROS (+)   Abdominal   Peds  Hematology negative hematology ROS (+)   Anesthesia Other Findings   Reproductive/Obstetrics  Breast cancer S/p hysterectomy                             Anesthesia Physical Anesthesia Plan  ASA: II  Anesthesia Plan: General   Post-op Pain Management:    Induction: Intravenous  PONV Risk Score and Plan: 4 or greater and Treatment may vary due to age or medical condition, Ondansetron, Midazolam, Dexamethasone and Scopolamine patch - Pre-op  Airway Management Planned: Oral ETT  Additional Equipment: None  Intra-op Plan:   Post-operative Plan: Extubation in OR  Informed Consent: I have reviewed the patients History and Physical, chart, labs and discussed the procedure including the risks, benefits and alternatives for the proposed anesthesia with the patient or authorized representative who has indicated his/her understanding and acceptance.     Dental advisory given  Plan Discussed with: CRNA and Anesthesiologist  Anesthesia Plan Comments:        Anesthesia Quick Evaluation

## 2019-04-13 NOTE — Anesthesia Procedure Notes (Signed)
Procedure Name: Intubation Date/Time: 04/13/2019 2:03 PM Performed by: Bryson Corona, CRNA Pre-anesthesia Checklist: Patient identified, Emergency Drugs available, Suction available and Patient being monitored Patient Re-evaluated:Patient Re-evaluated prior to induction Oxygen Delivery Method: Circle System Utilized Preoxygenation: Pre-oxygenation with 100% oxygen Induction Type: IV induction and Rapid sequence Laryngoscope Size: Mac and 3 Grade View: Grade I Tube type: Oral Tube size: 7.0 mm Number of attempts: 1 Airway Equipment and Method: Stylet Placement Confirmation: ETT inserted through vocal cords under direct vision,  positive ETCO2 and breath sounds checked- equal and bilateral Secured at: 21 cm Tube secured with: Tape Dental Injury: Teeth and Oropharynx as per pre-operative assessment

## 2019-04-13 NOTE — Discharge Instructions (Signed)
INSTRUCTIONS FOR AFTER BREAST SURGERY   You are getting ready to undergo breast surgery.  You will likely have some questions about what to expect following your operation.  The following information will help you and your family understand what to expect when you are discharged from the hospital.  Following these guidelines will help ensure a smooth recovery and reduce risks of complications.   Postoperative instructions include information on: diet, wound care, medications and physical activity.  AFTER SURGERY Expect to go home after the procedure.  In some cases, you may need to spend one night in the hospital for observation.  DIET Breast surgery does not require a specific diet.  However, I have to mention that the healthier you eat the better your body can start healing. It is important to increasing your protein intake.  This means limiting the foods with sugar and carbohydrates.  Focus on vegetables and some meat.  If you have any liposuction during your procedure be sure to drink water.  If your urine is bright yellow, then it is concentrated, and you need to drink more water.  As a general rule after surgery, you should have 8 ounces of water every hour while awake.  If you find you are persistently nauseated or unable to take in liquids let us know.  NO TOBACCO USE or EXPOSURE.  This will slow your healing process and increase the risk of a wound.  WOUND CARE While you have drains clean with baby wipes until the drains are removed.  If you have steri-strips / tape directly attached to your skin leave them in place. You can shower when the drains have all been removed.  Use fragrance free soap.  Dial, [place] and [place] are usually mild on the skin.  No baths, pools or hot tubs for two weeks. We close your incision to leave the smallest and best-looking scar. No ointment or creams on your incisions until given the go ahead.  Especially not Neosporin (Too many skin reactions with this one).  A  month after surgery you can use Mederma and start massaging the scar. We ask you to wear your binder or sports bra for the first 6 weeks around the clock, including while sleeping. This provides added comfort and helps reduce the fluid accumulation at the surgery site.  ACTIVITY No heavy lifting until cleared by the doctor.  This usually means no more than a half-gallon of milk.  It is OK to walk and climb stairs. In fact, moving your legs is very important to decrease your risk of a blood clot.  It will also help keep you from getting deconditioned.  Every 1 to 2 hours get up and walk for 5 minutes. This will help with a quicker recovery back to normal.  Let pain be your guide so you don't do too much.  NO, you cannot do the spring cleaning and don't plan on taking care of anyone else.  This is your time for TLC.  You will be more comfortable if you sleep and rest with your head elevated either with a few pillows under you or in a recliner.  No stomach sleeping for a few months.  WORK Everyone returns to work at different times. As a rough guide, most people take at least 1 - 2 weeks off prior to returning to work. If you need documentation for your job, bring the forms to your postoperative follow up visit.  DRIVING Arrange for someone to bring you home from  the hospital.  You may be able to drive a few days after surgery but not while taking any narcotics or valium.  BOWEL MOVEMENTS Constipation can occur after anesthesia and while taking pain medication.  It is important to stay ahead for your comfort.  We recommend taking Milk of Magnesia (2 tablespoons; twice a day) while taking the pain pills.  SEROMA This is fluid your body tried to put in the surgical site.  This is normal but if it creates tight skinny skin let us know.  It usually decreases in a few weeks.  WHEN TO CALL Call your surgeon's office if any of the following occur:  Fever 101 degrees F or greater  Excessive bleeding or  fluid from the incision site.  Pain that increases over time without aid from the medications  Redness, warmth, or pus draining from incision sites  Persistent nausea or inability to take in liquids  Severe misshapen area that underwent the operation.  Here are some resources:  1. Plastic surgery website: https://www.plasticsurgery.org/for-medical-professionals/education-and-resources/publications/breast-reconstruction-magazine 2. Breast Reconstruction Awareness Campaign:  HotelL[employer]co[employer]nz 3. Plastic surgery Implant information:  https://www.plasticsurgery.org/patient-safety/breast-implant-safety

## 2019-04-13 NOTE — Interval H&P Note (Signed)
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  The various methods of treatment have been discussed with the patient and family. After consideration of risks, benefits and other options for treatment, the patient has consented to  Procedure(s) with comments: Right breast reconstruction with latissimus muscle flap and expander placement (Right) - Case length should be 3.5 hours as a surgical intervention.  The patient's history has been reviewed, patient examined, no change in status, stable for surgery.  I have reviewed the patient's chart and labs.  Questions were answered to the patient's satisfaction.     Loel Lofty Freddy Kinne

## 2019-04-13 NOTE — Interval H&P Note (Signed)
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  The various methods of treatment have been discussed with the patient and family. After consideration of risks, benefits and other options for treatment, the patient has consented to  Procedure(s) with comments: Right breast reconstruction with latissimus muscle flap and expander placement (Right) - Case length should be 3.5 hours as a surgical intervention.  The patient's history has been reviewed, patient examined, no change in status, stable for surgery.  I have reviewed the patient's chart and labs.  Questions were answered to the patient's satisfaction.     Loel Lofty Dillingham

## 2019-04-14 ENCOUNTER — Encounter (HOSPITAL_COMMUNITY): Payer: Self-pay | Admitting: Plastic Surgery

## 2019-04-14 LAB — HIV ANTIBODY (ROUTINE TESTING W REFLEX): HIV Screen 4th Generation wRfx: NONREACTIVE

## 2019-04-14 NOTE — Anesthesia Postprocedure Evaluation (Signed)
Anesthesia Post Note  Patient: JOELENE BARRIERE  Procedure(s) Performed: Right breast reconstruction with latissimus muscle flap and expander placement (Right Breast)     Patient location during evaluation: PACU Anesthesia Type: General Level of consciousness: awake and alert Pain management: pain level controlled Vital Signs Assessment: post-procedure vital signs reviewed and stable Respiratory status: spontaneous breathing, nonlabored ventilation, respiratory function stable and patient connected to nasal cannula oxygen Cardiovascular status: blood pressure returned to baseline and stable Postop Assessment: no apparent nausea or vomiting Anesthetic complications: no    Last Vitals:  Vitals:   04/14/19 0500 04/14/19 0821  BP: (!) 106/59 98/83  Pulse: 84 68  Resp: 18   Temp: 37 C 37.1 C  SpO2: 97% 94%    Last Pain:  Vitals:   04/14/19 0821  TempSrc: Oral  PainSc:                  Audry Pili

## 2019-04-14 NOTE — Plan of Care (Signed)
  Problem: Education: Goal: Knowledge of General Education information will improve Description Including pain rating scale, medication(s)/side effects and non-pharmacologic comfort measures Outcome: Progressing   Problem: Education: Goal: Knowledge of General Education information will improve Description Including pain rating scale, medication(s)/side effects and non-pharmacologic comfort measures Outcome: Progressing   Problem: Pain Managment: Goal: General experience of comfort will improve Outcome: Progressing

## 2019-04-14 NOTE — Discharge Summary (Signed)
Physician Discharge Summary  Patient ID: Jasmine [name] MRN: [id number] DOB/AGE: 01-27-1966 53 y.o.  Admit date: 04/13/2019 Discharge date: 04/14/2019  Admission Diagnoses:  Discharge Diagnoses:  Active Problems:   Acquired absence of right breast   Discharged Condition: good  Hospital Course: The patient was admitted and taken to the operating room for a right latissimus muscle flap.  She had undergone a mastectomy and had some complications after attempts at expander reconstruction.  She presents in good condition.  She did well throughout the surgery.  She had 2 drains in the back and one drain placed in the front.  She was then managed on the surgery floor postoperatively.  She her pain was well tolerated.  She was doing well eating, walking and using the restroom independently.  Patient states she is ready to go home.  Consults: None  Significant Diagnostic Studies: none  Treatments: surgery  Discharge Exam: Blood pressure (!) 106/59, pulse 84, temperature 98.6 F (37 C), temperature source Oral, resp. rate 18, height 5\' 2"  (1.575 m), weight 68.3 kg, SpO2 97 %. General appearance: alert, cooperative and no distress Incision/Wound: Flap with good capillary refill and incisions intact.  Disposition: Discharge disposition: 01-Home or Self Care       Discharge Instructions    Call MD for:  difficulty breathing, headache or visual disturbances   Complete by:  As directed    Call MD for:  persistant nausea and vomiting   Complete by:  As directed    Call MD for:  redness, tenderness, or signs of infection (pain, swelling, redness, odor or green/yellow discharge around incision site)   Complete by:  As directed    Call MD for:  severe uncontrolled pain   Complete by:  As directed    Call MD for:  temperature >100.4   Complete by:  As directed    Diet general   Complete by:  As directed    Discharge wound care:   Complete by:  As directed    Drain care   Driving  Restrictions   Complete by:  As directed    No driving while on pain medication.   Increase activity slowly   Complete by:  As directed    Lifting restrictions   Complete by:  As directed    No heavy lifting more than a gallon of milk.     Allergies as of 04/14/2019      Reactions   Bee Venom Anaphylaxis   Swells internally      Medication List    TAKE these medications   ALPRAZolam 0.5 MG tablet Commonly known as:  XANAX Take 0.5 mg by mouth 2 (two) times daily as needed for anxiety.   amphetamine-dextroamphetamine 30 MG 24 hr capsule Commonly known as:  ADDERALL XR Take 30 mg by mouth daily.   FLUoxetine 20 MG capsule Commonly known as:  PROZAC Take 20 mg by mouth daily.   Ibuprofen 200 MG Caps Take 200 mg by mouth every 6 (six) hours as needed (for pain or headaches).   letrozole 2.5 MG tablet Commonly known as:  FEMARA Take 2.5 mg by mouth daily.   promethazine 6.25 MG/5ML syrup Commonly known as:  PHENERGAN Take 6.25 mg by mouth every 6 (six) hours as needed for nausea or vomiting.            Discharge Care Instructions  (From admission, onward)         Start     Ordered  04/14/19 0000  Discharge wound care:    Comments:  Drain care   04/14/19 0731         Follow-up Information    Dillingham, Loel Lofty, DO In 1 week.   Specialty:  Plastic Surgery Contact information: [address] [place] [address] [phone]           Signed: Wallace Going 04/14/2019, 7:32 AM

## 2019-04-23 ENCOUNTER — Ambulatory Visit (INDEPENDENT_AMBULATORY_CARE_PROVIDER_SITE_OTHER): Payer: Commercial Managed Care - PPO | Admitting: Plastic Surgery

## 2019-04-23 ENCOUNTER — Other Ambulatory Visit: Payer: Self-pay

## 2019-04-23 ENCOUNTER — Encounter: Payer: Self-pay | Admitting: Plastic Surgery

## 2019-04-23 VITALS — BP 107/71 | HR 86 | Temp 98.6°F | Ht 62.0 in | Wt 155.2 lb

## 2019-04-23 DIAGNOSIS — Z9011 Acquired absence of right breast and nipple: Secondary | ICD-10-CM

## 2019-04-23 DIAGNOSIS — Z9013 Acquired absence of bilateral breasts and nipples: Secondary | ICD-10-CM

## 2019-04-23 NOTE — Progress Notes (Signed)
The patient is a 53 year old female here for follow-up after undergoing a right breast reconstruction.  She has an expander on the left foot.  She had latissimus and expander placed a week ago on the right.  Her drain output in all 3 drains is minimal.  She has a little bit of bruising on the lateral aspect of the flap.  She is still smoking.  She is also not drinking very much water.  The posterior superior drain was removed.  I expressed my concern about the flap viability if she continues to smoke.  I have requested no smoking and increasing her water intake.  I would like to see her back in 1 week.

## 2019-04-28 ENCOUNTER — Telehealth: Payer: Self-pay | Admitting: *Deleted

## 2019-04-28 NOTE — Telephone Encounter (Signed)
Informed Dr. Marla Roe of the message below on yesterday, and she stated to have the patient use vaseline dressing on the area and we will see her at her appointment on Friday.  Called and spoke with the patient on yesterday and informed her of what Dr. Marla Roe wanted her to use on the area, and that we will see her on Friday at her appointment.  Patient verbalized understanding and agreed.//AB/CMA

## 2019-04-28 NOTE — Telephone Encounter (Signed)
Received a call from the patient on (04/26/19) saying that she has noticed a spot on her breast where the surgery was done.  It's like a small hole but no drainage.  She noticed it yesterday.  But this morning she noticed a little leak on the gauze.  Asked the patient if there is any pain or if it looked infected, and she said no pain and it does not seem to be infected.  She said she has an appointment with [place] this Friday (04/30/19), but she wanted to see if Dr. Marla Roe feels she needs to be seen sooner.  She stated that she can send a picture of the area so Dr. Marla Roe can see it.  I asked the patient if she has MyChart she can send the picture through [place].   She said she does have MyChart but she don't see how to send it through [place].  Informed the patient that Dr. Marla Roe is not in the office today so I will not be able to informed her of the message until tomorrow.  Patient stated that would be fine.//AB/CMA

## 2019-04-30 ENCOUNTER — Other Ambulatory Visit: Payer: Self-pay

## 2019-04-30 ENCOUNTER — Encounter: Payer: Self-pay | Admitting: Plastic Surgery

## 2019-04-30 ENCOUNTER — Ambulatory Visit (INDEPENDENT_AMBULATORY_CARE_PROVIDER_SITE_OTHER): Payer: Commercial Managed Care - PPO | Admitting: Plastic Surgery

## 2019-04-30 VITALS — BP 100/68 | HR 61 | Temp 98.1°F | Ht 62.0 in | Wt 152.0 lb

## 2019-04-30 DIAGNOSIS — Z9011 Acquired absence of right breast and nipple: Secondary | ICD-10-CM

## 2019-04-30 DIAGNOSIS — Z9013 Acquired absence of bilateral breasts and nipples: Secondary | ICD-10-CM

## 2019-04-30 NOTE — Progress Notes (Signed)
   Subjective:    Patient ID: Jasmine [name], female    DOB: 1965-11-25, 53 y.o.   MRN: [id number]  The patient is a 53 yrs old wf here for follow up on her right breast reconstruction. The flap looks better today but the lateral aspect is slightly dark.  No sign of infection.  Incisions healing well.  Minimal output from the drains.    Review of Systems  Constitutional: Negative.   HENT: Negative.   Eyes: Negative.   Respiratory: Negative.   Cardiovascular: Negative.   Genitourinary: Negative.   Musculoskeletal: Negative.   Hematological: Negative.        Objective:   Physical Exam Vitals signs and nursing note reviewed.  Constitutional:      Appearance: Normal appearance.  HENT:     Head: Normocephalic and atraumatic.  Cardiovascular:     Rate and Rhythm: Normal rate.  Pulmonary:     Effort: Pulmonary effort is normal.  Abdominal:     General: Abdomen is flat.  Neurological:     General: No focal deficit present.     Mental Status: She is alert and oriented to person, place, and time.  Psychiatric:        Mood and Affect: Mood normal.        Behavior: Behavior normal.        Assessment & Plan:     ICD-10-CM   1. Acquired absence of right breast  Z90.11   2. S/P mastectomy, bilateral  Z90.13   3. Acquired absence of breast and absent nipple, bilateral  Z90.13     We placed injectable saline in the Expander using a sterile technique: Right: 50 cc for a total of 200 / 375 cc  Requested the patient stop smoking again. The anterior drain was removed.

## 2019-05-07 ENCOUNTER — Other Ambulatory Visit: Payer: Self-pay

## 2019-05-07 ENCOUNTER — Ambulatory Visit (INDEPENDENT_AMBULATORY_CARE_PROVIDER_SITE_OTHER): Payer: Commercial Managed Care - PPO | Admitting: Plastic Surgery

## 2019-05-07 ENCOUNTER — Ambulatory Visit: Payer: Commercial Managed Care - PPO | Admitting: Plastic Surgery

## 2019-05-07 ENCOUNTER — Encounter: Payer: Self-pay | Admitting: Plastic Surgery

## 2019-05-07 VITALS — BP 113/70 | HR 85 | Temp 98.1°F | Ht 62.0 in | Wt 154.0 lb

## 2019-05-07 DIAGNOSIS — Z9011 Acquired absence of right breast and nipple: Secondary | ICD-10-CM

## 2019-05-07 NOTE — Progress Notes (Signed)
   Subjective:    Patient ID: Jasmine [name], female    DOB: 28-Sep-1966, 53 y.o.   MRN: [id number]  The patient is a 53 year old female here for follow-up on her right breast reconstruction.  She underwent a latissimus muscle flap.  I was a little concerned about the lateral portion of the flap last week.  Today she looks much better.  The flap has good color and good capillary refill.  I have stressed to her again the importance of not smoking.  No sign of infection.  No fluid retention.  I removed the dressing from her back.  And that is healing well.    Review of Systems  Constitutional: Negative.   HENT: Negative.   Eyes: Negative.   Respiratory: Negative.  Negative for chest tightness and shortness of breath.   Cardiovascular: Negative.  Negative for leg swelling.  Genitourinary: Negative.   Hematological: Negative.   Psychiatric/Behavioral: Negative.        Objective:   Physical Exam Vitals signs and nursing note reviewed.  Constitutional:      Appearance: Normal appearance.  HENT:     Head: Normocephalic and atraumatic.  Cardiovascular:     Rate and Rhythm: Normal rate.  Pulmonary:     Effort: Pulmonary effort is normal.  Neurological:     General: No focal deficit present.     Mental Status: She is alert. Mental status is at baseline.  Psychiatric:        Mood and Affect: Mood normal.        Behavior: Behavior normal.        Thought Content: Thought content normal.         Assessment & Plan:     ICD-10-CM   1. Acquired absence of right breast  Z90.11     We placed injectable saline in the Expander using a sterile technique: Right: 50 cc for a total of 250 / 375 cc Follow-up in 2 weeks.

## 2019-05-18 ENCOUNTER — Telehealth: Payer: Self-pay | Admitting: Plastic Surgery

## 2019-05-18 NOTE — Telephone Encounter (Signed)

## 2019-05-19 ENCOUNTER — Other Ambulatory Visit: Payer: Self-pay

## 2019-05-19 ENCOUNTER — Encounter: Payer: Self-pay | Admitting: Surgical

## 2019-05-19 ENCOUNTER — Encounter: Payer: Self-pay | Admitting: Plastic Surgery

## 2019-05-19 ENCOUNTER — Ambulatory Visit (INDEPENDENT_AMBULATORY_CARE_PROVIDER_SITE_OTHER): Payer: Commercial Managed Care - PPO | Admitting: Surgical

## 2019-05-19 VITALS — BP 111/64 | HR 95 | Temp 98.4°F | Ht 62.0 in | Wt 155.6 lb

## 2019-05-19 DIAGNOSIS — Z9011 Acquired absence of right breast and nipple: Secondary | ICD-10-CM

## 2019-05-19 NOTE — Progress Notes (Signed)
   Subjective:     Patient ID: Jasmine [name], female    DOB: 1966/02/10, 53 y.o.   MRN: [id number]  Chief Complaint  Patient presents with  . Follow-up    2 weeks    HPI: The patient is a 53 y.o. yrs old female here for follow up on her breast reconstruction. She had a right latissimus flap on 04/13/19.  She has decreased her smoking to 5 cigarettes/day, encouraged her to continue decreasing as it will promote healthy healing.  Her skin flap looks good today with good cap refill. There was some scabbing along the inferior lateral portion of her flap, the scabbing was debrided. Overall her incisions on her right breast and back are healing well.  No sign of infection or seroma.   Review of Systems  Constitutional: Negative for chills, fever and weight loss.  HENT: Negative.   Respiratory: Negative.   Cardiovascular: Negative.   Skin: Negative.   Neurological: Negative for weakness.     Objective:   Vital Signs BP 111/64 (BP Location: Left Arm, Patient Position: Sitting, Cuff Size: Normal)   Pulse 95   Temp 98.4 F (36.9 C) (Oral)   Ht 5\' 2"  (1.575 m)   Wt 155 lb 9.6 oz (70.6 kg)   SpO2 99%   BMI 28.46 kg/m  Vital Signs and Nursing Note Reviewed  Physical Exam  Constitutional: She is oriented to person, place, and time and well-developed, well-nourished, and in no distress. No distress.  HENT:  Head: Normocephalic and atraumatic.  Neck: Normal range of motion. Neck supple.  Cardiovascular: Normal rate and regular rhythm.  Pulmonary/Chest: Effort normal and breath sounds normal.  Musculoskeletal: Normal range of motion.  Neurological: She is alert and oriented to person, place, and time.  Skin: Skin is warm and dry. She is not diaphoretic.  Scabbing over inferior lateral flap incision.   Psychiatric: Mood and affect normal.    Assessment/Plan:     ICD-10-CM   1. Acquired absence of right breast  Z90.11    Overall, Jasmine [name] is doing well. She continues to  decrease her amount of smoking, encouraged her to continue this to promote healthy healing.  The incision on her inferior lateral flap was debrided and some gauze were placed over it.  We placed injectable saline in the Expander using a sterile technique: Right: 50 cc for a total of 300 / 375 cc  Her Left expander has 510/375 cc  Follow-up in 2 weeks.   Carola Rhine Maeven Mcdougall, PA-C 05/19/2019, 3:57 PM

## 2019-05-20 ENCOUNTER — Ambulatory Visit: Payer: Commercial Managed Care - PPO | Admitting: Plastic Surgery

## 2019-05-31 ENCOUNTER — Telehealth: Payer: Self-pay | Admitting: Plastic Surgery

## 2019-05-31 NOTE — Telephone Encounter (Signed)
Patient called to scheduled appointment for tomorrow. Patient answered the following questions: 1. To the best of your knowledge, have you been in close contact with any one with a confirmed diagnosis of COVID-19? No 2. Have you had any one   or more of the following; fever, chills, cough, shortness of breath, or any flu-like symptoms? No 3. Have you been diagnosed with or have a previous diagnosis of COVID 19? No 4. I am going to go over a few other symptoms with you. Please let me know if you are experiencing any of the following: None of the below a. Ear, nose, or throat discomfort b. A sore throat c. Headache d. Muscle pain e. Diarrhea f. Loss of taste or smell

## 2019-06-01 ENCOUNTER — Ambulatory Visit (INDEPENDENT_AMBULATORY_CARE_PROVIDER_SITE_OTHER): Payer: Commercial Managed Care - PPO | Admitting: Surgical

## 2019-06-01 ENCOUNTER — Other Ambulatory Visit: Payer: Self-pay

## 2019-06-01 ENCOUNTER — Encounter: Payer: Self-pay | Admitting: Plastic Surgery

## 2019-06-01 ENCOUNTER — Encounter: Payer: Self-pay | Admitting: Surgical

## 2019-06-01 VITALS — BP 109/68 | HR 85 | Temp 98.2°F | Ht 62.0 in | Wt 153.8 lb

## 2019-06-01 DIAGNOSIS — Z9011 Acquired absence of right breast and nipple: Secondary | ICD-10-CM

## 2019-06-01 NOTE — Progress Notes (Signed)
Subjective:     Patient ID: Jasmine [name], female    DOB: 03/09/1966, 53 y.o.   MRN: [id number]  Chief Complaint  Patient presents with  . Follow-up    on (R) breast    HPI: The patient is a 53 y.o. yrs old female here for follow up. She called over the weekend with concerns about infection.   She did not report any measurable fever, but reported she felt different. She went back to work on Tuesday and felt tired and worn out Wednesday. She called over the weekend with concerns of possible infection in her R breast latissimus flap. On Saturday, she reportedly started taking levofloxacin 750mg  daily that she was previously prescribed by PCP. She has continued to take this daily.   Today, she has no fever. The incisions are healing okay, there is some scabbing/crusting on the superior medial incision as well as the lateral aspect of her latissimus flap. Does not appear to be any overt signs of infection. Some drainage noted on her ABD pad, yellowish in color.   Review of Systems  Constitutional: Positive for malaise/fatigue. Negative for chills, diaphoresis and fever.  Respiratory: Negative.   Cardiovascular: Negative.   Genitourinary: Negative.   Musculoskeletal: Negative.   Skin: Negative for itching and rash.       + scabbing over latissimus incision      Objective:   Vital Signs BP 109/68 (BP Location: Left Arm, Patient Position: Sitting, Cuff Size: Normal)   Pulse 85   Temp 98.2 F (36.8 C) (Temporal)   Ht 5\' 2"  (1.575 m)   Wt 153 lb 12.8 oz (69.8 kg)   SpO2 99%   BMI 28.13 kg/m  Vital Signs and Nursing Note Reviewed  Physical Exam  Constitutional: She is oriented to person, place, and time and well-developed, well-nourished, and in no distress. No distress.  HENT:  Head: Normocephalic.  Neck: Normal range of motion. Neck supple.  Cardiovascular: Normal rate.  Pulmonary/Chest: Effort normal.    Abdominal: Soft.  Musculoskeletal: Normal range of motion.   Neurological: She is alert and oriented to person, place, and time. Gait normal.  Skin: Skin is warm and dry. She is not diaphoretic. There is erythema (some erythema noted around latissimus flap incision, minimal.).  Her latissimus flap has good capillary refill.   Psychiatric: Mood and affect normal.    Assessment/Plan:     ICD-10-CM   1. Acquired absence of right breast  Z90.11    The patient called over the weekend with questions and concerns of a possible infection of her right breast latissimus flap.  Today on evaluation, there does not appear to be any overt signs of infection.  She appears well, no fever.  She is continuing to smoke approximately 5 cigarettes/day.  This is affecting the rate of healing to her latissimus flap incisions.  She has been counseled on decreasing her tobacco use and increasing healthy eating habits, avoiding carbs and sugars.  She should finish her levofloxacin.  Vaseline with gauze were placed on her to areas of concern.  This was then covered with an ABD pad.  Right expander has total of 300 / 375cc Left expander has 510/375 cc  She was given strict return precautions including, but not limited to fever, purulent drainage, chills, nausea, vomiting.   Follow-up in 7 to 10 days for reevaluation and possible expansion pending her exam on that date.  Carola Rhine T[place]alaya Perren, PA-C 06/01/2019, 10:10 AM

## 2019-06-02 ENCOUNTER — Telehealth: Payer: Self-pay

## 2019-06-02 NOTE — Telephone Encounter (Signed)
Reviewed pt's status to Dr. Marla Roe- she recommends pt continue antibiotic, fluids & dressing/wound care- & we will see pt Friday 06/03/19 @ 8:30am I did instruct pt to seek care at emergency room if condition worsens  They will call Dr. Marla Roe for any increased concerns- otherwise f/u on Friday Pt & her Mom understand & agree with plan of care [facility]

## 2019-06-02 NOTE — Telephone Encounter (Signed)
Call from pt's [place] [phone] she calls to report that pt is c/o increased drainage upper area of breast (latisse- flap) site- drainage  described as "yellow/pus" per her mother  Also c/o chills- no fever/temp is 98.1 temporal -however she did take a skin temp over the breast site- which was 99.1 She denies any increase in pain, she is able to take deep breaths in & denies any cough Appetite is decreased- but she is able to drink fluids, & no n/v They continue to change the dressing daily using vaseline & 4x4 gauze Pt continues to take Levofloxacin 750mg /daily

## 2019-06-03 ENCOUNTER — Telehealth: Payer: Self-pay | Admitting: Plastic Surgery

## 2019-06-03 NOTE — Telephone Encounter (Signed)

## 2019-06-04 ENCOUNTER — Encounter: Payer: Self-pay | Admitting: Plastic Surgery

## 2019-06-04 ENCOUNTER — Ambulatory Visit (INDEPENDENT_AMBULATORY_CARE_PROVIDER_SITE_OTHER): Payer: Commercial Managed Care - PPO | Admitting: Plastic Surgery

## 2019-06-04 ENCOUNTER — Ambulatory Visit: Payer: Commercial Managed Care - PPO | Admitting: Plastic Surgery

## 2019-06-04 ENCOUNTER — Other Ambulatory Visit: Payer: Self-pay

## 2019-06-04 VITALS — BP 101/66 | HR 80 | Temp 97.7°F | Ht 62.0 in | Wt 153.0 lb

## 2019-06-04 DIAGNOSIS — Z9013 Acquired absence of bilateral breasts and nipples: Secondary | ICD-10-CM

## 2019-06-04 NOTE — Progress Notes (Signed)
   Subjective:    Patient ID: Jasmine [name], female    DOB: 08-28-1966, 52 y.o.   MRN: [id number]  The patient is a 53 year old female who underwent a right latissimus muscle flap for her breast reconstruction.  She has had bilateral mastectomies and has an expander on the left.  She had some breakdown of her flap incision and was worried that it was getting infected.  She is still smoking.  It does not look infected.  There is some breakdown on the superior medial aspect that is 5 x 8 mm and on the lateral aspect there is 3 x 5 mm.  She denies any fevers or chills.   Review of Systems  Constitutional: Negative for activity change and appetite change.  Eyes: Negative.   Gastrointestinal: Negative for abdominal pain.  Genitourinary: Negative.   Musculoskeletal: Negative for back pain.       Objective:   Physical Exam Vitals signs and nursing note reviewed.  Constitutional:      Appearance: Normal appearance.  HENT:     Head: Normocephalic and atraumatic.  Cardiovascular:     Rate and Rhythm: Normal rate.  Pulmonary:     Effort: Pulmonary effort is normal.  Neurological:     General: No focal deficit present.     Mental Status: She is alert. Mental status is at baseline.  Psychiatric:        Mood and Affect: Mood normal.         Assessment & Plan:     ICD-10-CM   1. S/P mastectomy, bilateral  Z90.13   2. Acquired absence of breast and absent nipple, bilateral  Z90.13     Continue Vaseline to the areas and I would like to see her back in 1 to 2 weeks.  We may need to do a revision.

## 2019-06-08 ENCOUNTER — Encounter: Payer: Self-pay | Admitting: Plastic Surgery

## 2019-06-10 NOTE — Progress Notes (Signed)
   Subjective:     Patient ID: Jasmine [name], female    DOB: 06/06/1966, 53 y.o.   MRN: [id number]  Chief Complaint  Patient presents with  . Follow-up    on (R) breast reconstruction    HPI: The patient is a 53 y.o. female here for follow-up.  She has a history of bilateral mastectomies with a  right latissimus muscle flap.  She currently has 300 out of 375 cc in her right expander and 510 out of 3 to 75 cc in her left expander.  At her last visit she had a wound along with the superior and lateral aspect of her latissimus flap.  She is still smoking about 5 cigarettes/day.  Her latissimus incision is healing well on her right back.  Her flap on her right breast has good blood flow and capillary refill.  She has not had any fevers or chills.  Review of Systems  Constitutional: Negative for chills, diaphoresis, fever and malaise/fatigue.  Respiratory: Negative for cough, shortness of breath and wheezing.   Cardiovascular: Negative.  Negative for chest pain and palpitations.  Skin: Negative for itching and rash.  Psychiatric/Behavioral: Negative.     Objective:   Vital Signs BP 119/76 (BP Location: Left Arm, Patient Position: Sitting, Cuff Size: Normal)   Pulse 100   Temp 97.8 F (36.6 C) (Temporal)   Ht 5\' 2"  (1.575 m)   Wt 153 lb 3.2 oz (69.5 kg)   SpO2 98%   BMI 28.02 kg/m  Vital Signs and Nursing Note Reviewed Chaperone present. Physical Exam  Constitutional: She is oriented to person, place, and time and well-developed, well-nourished, and in no distress. No distress.  HENT:  Head: Normocephalic.  Cardiovascular: Normal rate.  Pulmonary/Chest: Effort normal. No respiratory distress.    Neurological: She is alert and oriented to person, place, and time. Gait normal.  Skin: Skin is warm and dry. No rash noted. She is not diaphoretic. No erythema. No pallor.  Psychiatric: Mood and affect normal.    Assessment/Plan:     ICD-10-CM   1. Acquired absence of  breast and absent nipple, bilateral  Z90.13   2. S/P mastectomy, bilateral  Z90.13    Donated ACell applied to the superior medial wound of her latissimus flap on her right breast.  She should leave this for 2 days then add K-Y jelly daily.  She can shower 3 days from today (Monday).  Start with Vaseline daily on Monday. She was counseled on smoking cessation.  Continue to eat healthy, drink plenty of fluids and take a multivitamin. We will see her back in 1 week.  Carola Rhine Yue Flanigan, PA-C 06/11/2019, 2:10 PM

## 2019-06-11 ENCOUNTER — Encounter: Payer: Self-pay | Admitting: Plastic Surgery

## 2019-06-11 ENCOUNTER — Ambulatory Visit (INDEPENDENT_AMBULATORY_CARE_PROVIDER_SITE_OTHER): Payer: Commercial Managed Care - PPO | Admitting: Surgical

## 2019-06-11 ENCOUNTER — Other Ambulatory Visit: Payer: Self-pay

## 2019-06-11 ENCOUNTER — Encounter: Payer: Self-pay | Admitting: Surgical

## 2019-06-11 VITALS — BP 119/76 | HR 100 | Temp 97.8°F | Ht 62.0 in | Wt 153.2 lb

## 2019-06-11 DIAGNOSIS — Z9013 Acquired absence of bilateral breasts and nipples: Secondary | ICD-10-CM

## 2019-06-11 DIAGNOSIS — C50511 Malignant neoplasm of lower-outer quadrant of right female breast: Secondary | ICD-10-CM

## 2019-06-17 ENCOUNTER — Encounter: Payer: Self-pay | Admitting: Plastic Surgery

## 2019-06-17 ENCOUNTER — Ambulatory Visit (INDEPENDENT_AMBULATORY_CARE_PROVIDER_SITE_OTHER): Payer: Commercial Managed Care - PPO | Admitting: Plastic Surgery

## 2019-06-17 ENCOUNTER — Other Ambulatory Visit: Payer: Self-pay

## 2019-06-17 VITALS — BP 102/67 | HR 92 | Temp 98.0°F | Ht 62.0 in | Wt 152.0 lb

## 2019-06-17 DIAGNOSIS — Z9013 Acquired absence of bilateral breasts and nipples: Secondary | ICD-10-CM

## 2019-06-17 NOTE — Progress Notes (Signed)
The patient is a 53 year old female here for follow-up on her right breast reconstruction.  She has some skin breakdown.  We did put some donated a cell.  It is healing and and looking very nice.  She can shower and use Vaseline to the area.  We will plan to start expanding next week.  I have asked her to cut back on the smoking as well.

## 2019-06-23 NOTE — Progress Notes (Signed)
     Patient ID: Jasmine [name], female    DOB: 18-May-1966, 53 y.o.   MRN: [id number]  Chief Complaint  Patient presents with  . Follow-up    1 week and fill    HPI Jasmine [name] is a 53 y.o. female here for follow up after reconstruction with expanders in place, right latissimus flap.   We did not expand at her last visit due to wound along the latissimus flap. We placed Donated Acell at her last visit.   Today she is doing well. Her wound has significantly improved and appears to be granulating nicely. No sign of infection, drainage, seroma, hematoma.  She is still smoking 5 cigarettes/day, she was counseled to try and decrease this amount to promote wound healing.  Review of Systems  Constitutional: Negative for chills, diaphoresis, fatigue and fever.  Respiratory: Negative.   Cardiovascular: Negative.   Gastrointestinal: Negative.   Musculoskeletal: Negative.     Objective:   Vitals:   06/25/19 0822  BP: 105/68  Pulse: 90  Temp: 97.7 F (36.5 C)  SpO2: 98%    Physical Exam Constitutional:      Appearance: Normal appearance.  HENT:     Head: Normocephalic and atraumatic.  Cardiovascular:     Rate and Rhythm: Normal rate and regular rhythm.     Pulses: Normal pulses.  Pulmonary:     Effort: Pulmonary effort is normal.  Skin:    General: Skin is warm and dry.     Comments: + wound, right breast (superior medial and lateral)   Neurological:     General: No focal deficit present.     Mental Status: She is alert and oriented to person, place, and time. Mental status is at baseline.  Psychiatric:        Mood and Affect: Mood normal.        Behavior: Behavior normal.     Assessment & Plan:     ICD-10-CM   1. S/P mastectomy, bilateral  Z90.13   2. Malignant neoplasm of upper-outer quadrant of right breast in female, estrogen receptor positive ([place])  C50.411    Z17.0   3. Acquired absence of breast and absent nipple, bilateral  Z90.13    Jasmine [name] is  doing well. She has some improvement in her right breast wound after placement of Donated Acell on 06/17/19. She can continue with daily Vaseline to the area.  We placed injectable saline in the Expander using a sterile technique: Right: 50 cc for a total of 350 / 375 cc Left: total of 510 / 375 cc   She is comfortable with the left side, would potential want one more fill on the left and have the left matched.  Follow up in 2 weeks. If her wound is stable, she will likely be ready for exchange to implants in about 8-10 weeks.   Return in about 2 weeks (around 07/09/2019).  Jasmine Rhine Kaytlynn Kochan, PA-C

## 2019-06-24 ENCOUNTER — Telehealth: Payer: Self-pay

## 2019-06-24 NOTE — Telephone Encounter (Signed)

## 2019-06-25 ENCOUNTER — Encounter: Payer: Self-pay | Admitting: Surgical

## 2019-06-25 ENCOUNTER — Other Ambulatory Visit: Payer: Self-pay

## 2019-06-25 ENCOUNTER — Ambulatory Visit (INDEPENDENT_AMBULATORY_CARE_PROVIDER_SITE_OTHER): Payer: Commercial Managed Care - PPO | Admitting: Surgical

## 2019-06-25 VITALS — BP 105/68 | HR 90 | Temp 97.7°F | Ht 62.0 in | Wt 151.4 lb

## 2019-06-25 DIAGNOSIS — Z170 Estrogen receptor positive status [ER+]: Secondary | ICD-10-CM

## 2019-06-25 DIAGNOSIS — C50411 Malignant neoplasm of upper-outer quadrant of right female breast: Secondary | ICD-10-CM

## 2019-06-25 DIAGNOSIS — Z9013 Acquired absence of bilateral breasts and nipples: Secondary | ICD-10-CM

## 2019-06-25 MED ORDER — DIAZEPAM 2 MG PO TABS: 2.0000 mg | ORAL_TABLET | Freq: Two times a day (BID) | ORAL | 0 refills | Status: AC | PRN

## 2019-06-25 NOTE — Addendum Note (Signed)
Addended by: Wallace Going on: 06/25/2019 04:00 PM   Modules accepted: Orders

## 2019-07-09 ENCOUNTER — Encounter: Payer: Self-pay | Admitting: Surgical

## 2019-07-09 ENCOUNTER — Ambulatory Visit (INDEPENDENT_AMBULATORY_CARE_PROVIDER_SITE_OTHER): Payer: Commercial Managed Care - PPO | Admitting: Surgical

## 2019-07-09 ENCOUNTER — Other Ambulatory Visit: Payer: Self-pay

## 2019-07-09 VITALS — BP 106/74 | HR 95 | Temp 97.8°F | Ht 62.0 in | Wt 151.8 lb

## 2019-07-09 DIAGNOSIS — Z9013 Acquired absence of bilateral breasts and nipples: Secondary | ICD-10-CM

## 2019-07-09 NOTE — Progress Notes (Signed)
Patient ID: Jasmine [name], female    DOB: [birth year], 53 y.o.   MRN: [id number]  Chief Complaint  Patient presents with   Pre-op Exam    for the removal of (B) tissue expanders and placement of implants      ICD-10-CM   1. S/P mastectomy, bilateral  Z90.13   2. Acquired absence of breast and absent nipple, bilateral  Z90.13      History of Present Illness: Jasmine [name] is a 53 y.o.  female  with a history of bilateral mastectomies with reconstruction. She has a right sided latissimus flap.  She presents for preoperative evaluation for upcoming procedure, removal of bilateral tissue expanders with placement of implants, scheduled for 06/20/2019 with Dr. Marla Roe.  The patient has not had problems with anesthesia.   She has a PMHx of: Breast cancer, anxiety. No Hx or FMHx of dvt/pe/vte. She currently smokes 5 cigarettes per day.  She does not take any blood thinners. She is generally healthy and has had no recent colds or illnesses. She has been quarantining with her family.   Her incision on her right latissimus flap is healing nicely. The wounds responded well to Acell and are beginning to epithelialize and scab over. No sign of infection, seroma, hematoma.  Past Medical History: Allergies: Allergies  Allergen Reactions   Bee Venom Anaphylaxis    Swells internally    Current Medications:  Current Outpatient Medications:    ALPRAZolam (XANAX) 0.5 MG tablet, Take 0.5 mg by mouth 2 (two) times daily as needed for anxiety. , Disp: , Rfl:    amphetamine-dextroamphetamine (ADDERALL XR) 30 MG 24 hr capsule, Take 30 mg by mouth daily., Disp: , Rfl: 0   FLUoxetine (PROZAC) 20 MG capsule, Take 20 mg by mouth daily., Disp: , Rfl:    Ibuprofen 200 MG CAPS, Take 200 mg by mouth every 6 (six) hours as needed (for pain or headaches). , Disp: , Rfl:    letrozole (FEMARA) 2.5 MG tablet, Take 2.5 mg by mouth daily., Disp: , Rfl:    promethazine (PHENERGAN) 6.25 MG/5ML  syrup, Take 6.25 mg by mouth every 6 (six) hours as needed for nausea or vomiting. , Disp: , Rfl:   Past Medical Problems: Past Medical History:  Diagnosis Date   Anxiety    Cancer ([place])    right breast cancer   Depression    History of kidney stones    Pneumonia     Past Surgical History: Past Surgical History:  Procedure Laterality Date   ABDOMINAL HYSTERECTOMY  2008   BREAST RECONSTRUCTION WITH PLACEMENT OF TISSUE EXPANDER AND FLEX HD (ACELLULAR HYDRATED DERMIS) Bilateral 10/01/2017   Procedure: BILATERAL BREAST RECONSTRUCTION WITH PLACEMENT OF TISSUE EXPANDER AND FLEX HD (ACELLULAR HYDRATED DERMIS);  Surgeon: Wallace Going, DO;  Location: [facility];  Service: Plastics;  Laterality: Bilateral;   CHOLECYSTECTOMY  2008   DEBRIDEMENT AND CLOSURE WOUND Right 11/05/2017   Procedure: DEBRIDEMENT OF RIGHT BREAST WITH CLOSURE WOUND;  Surgeon: Wallace Going, DO;  Location: [facility];  Service: Plastics;  Laterality: Right;   DEBRIDEMENT AND CLOSURE WOUND Right 01/12/2018   Procedure: EXCISION OF RIGHT OPEN BREAST WOUND;  Surgeon: Wallace Going, DO;  Location: [facility];  Service: Plastics;  Laterality: Right;   LATISSIMUS FLAP TO BREAST Right 04/13/2019   Procedure: Right breast reconstruction with latissimus muscle flap and expander placement;  Surgeon: Wallace Going, DO;  Location: [facility];  Service:  Plastics;  Laterality: Right;  Case length should be 3.5 hours   MASTECTOMY W/ SENTINEL NODE BIOPSY Right 10/01/2017   Procedure: RIGHT  MASTECTOMY WITH RIGHT SENTINEL LYMPH NODE BIOPSY;  Surgeon: Coralie Keens, MD;  Location: [facility];  Service: General;  Laterality: Right;   SIMPLE MASTECTOMY WITH AXILLARY SENTINEL NODE BIOPSY Left 10/01/2017   Procedure: Left Mastectomy;  Surgeon: Coralie Keens, MD;  Location: [facility];  Service: General;  Laterality: Left;   WISDOM TOOTH EXTRACTION   1984    Social History: Social History   Socioeconomic History   Marital status: Significant Other    Spouse name: Not on file   Number of children: Not on file   Years of education: Not on file   Highest education level: Not on file  Occupational History   Not on file  Social Needs   Financial resource strain: Not on file   Food insecurity    Worry: Not on file    Inability: Not on file   Transportation needs    Medical: Not on file    Non-medical: Not on file  Tobacco Use   Smoking status: Current Some Day Smoker    Packs/day: 0.25   Smokeless tobacco: Never Used   Tobacco comment: "a couple a day"  Substance and Sexual Activity   Alcohol use: No    Frequency: Never   Drug use: No   Sexual activity: Yes    Birth control/protection: Surgical  Lifestyle   Physical activity    Days per week: Not on file    Minutes per session: Not on file   Stress: Not on file  Relationships   Social connections    Talks on phone: Not on file    Gets together: Not on file    Attends religious service: Not on file    Active member of club or organization: Not on file    Attends meetings of clubs or organizations: Not on file    Relationship status: Not on file   Intimate partner violence    Fear of current or ex partner: Not on file    Emotionally abused: Not on file    Physically abused: Not on file    Forced sexual activity: Not on file  Other Topics Concern   Not on file  Social History Narrative   Not on file    Family History: Family History  Problem Relation Age of Onset   Breast cancer Paternal Grandmother     Review of Systems: ROS  Physical Exam: Vital Signs BP 106/74 (BP Location: Left Arm, Patient Position: Sitting, Cuff Size: Normal)    Pulse 95    Temp 97.8 F (36.6 C) (Tympanic)    Ht 5\' 2"  (1.575 m)    Wt 151 lb 12.8 oz (68.9 kg)    SpO2 99%    BMI 27.76 kg/m  Nursing note and VS viewed. Physical Exam Exam conducted with a  chaperone present.  Constitutional:      General: She is not in acute distress.    Appearance: Normal appearance. She is not ill-appearing.  HENT:     Head: Normocephalic and atraumatic.  Eyes:     Pupils: Pupils are equal, round Neck:     Musculoskeletal: Normal range of motion. No LAD. Cardiovascular:     Rate and Rhythm: Normal rate and regular rhythm.     Pulses: Normal pulses.     Heart sounds: Normal heart sounds. No murmur.  Pulmonary:     Effort: Pulmonary effort is normal. No respiratory distress.     Breath sounds: Normal breath sounds. No wheezing.  Abdominal:     General: Abdomen is flat. There is no distension.     Palpations: Abdomen is soft.     Tenderness: There is no abdominal tenderness.  Musculoskeletal: Normal range of motion.  Skin:    General: Skin is warm and dry.     Findings: No erythema or rash.  Neurological:     General: No focal deficit present.     Mental Status: She is alert and oriented to person, place, and time. Mental status is at baseline.     Motor: No weakness.  Psychiatric:        Mood and Affect: Mood normal.        Behavior: Behavior normal.   Breast:  Right latissimus flap incision are c/d/i. No erythema. Good capillary refill. Left breast incision c/d/i. Healed nicely.   Assessment/Plan: Mrs. Spainhower is scheduled for removal of expanders with placement of implants, bilaterally with Dr. Marla Roe on 07/21/19.  Risks, benefits, and alternatives of procedure discussed, questions answered and consent obtained.    The risk that can be encountered with breast augmentation/implant exchange or were discussed and include the following but not limited to these:  Breast asymmetry, fluid accumulation, firmness of the breast, skin loss, fat necrosis of the breast tissue, bleeding, infection, healing delay.  Deep vein thrombosis, cardiac and pulmonary complications are risks to any procedure. The implant can have a faulty position or one different  from what you had desired.  The implant can have rippling, wrinkling, leakage or rupture. There are risks of anesthesia, changes to skin sensation and injury to nerves or blood vessels.  The muscle can be temporarily or permanently injured.  You may have an allergic reaction to tape, suture, glue, blood products which can result in skin discoloration, swelling, pain, skin lesions, poor healing.  Any of these can lead to the need for revisonal surgery or stage procedures.  A reduction has potential to interfere with diagnostic procedures.  Nipple or breast piercing can increase risks of infection.    This procedure is best done when the breast is fully developed.  Changes in the breast will continue to occur over time.  Pregnancy can alter the outcomes of previous breast reduction surgery, weight gain and weigh loss can also effect the long term appearance. Implants are not guaranteed to last a lifetime.  Future surgery may be required.  Regular examinations of the breast are required to evaluate the condition of your breasts and implants.  We placed injectable saline in the Expander using a sterile technique: Right: 100 cc for a total of 450 / 375 cc Left: total of 510 / 375 cc   We will plan for one-two more fills before surgery. She is nearly symmetric, but the right side is lagging slightly behind due to wound care delaying fills. Overall, she is doing great. If necessary, surgery can be pushed back to a farther date. We will re-evaluate at her next visit.  Follow up in 1 week.  Electronically signed by: Carola Rhine Hinley Brimage, PA-C 07/09/2019 1:46 PM

## 2019-07-16 ENCOUNTER — Other Ambulatory Visit: Payer: Self-pay

## 2019-07-16 ENCOUNTER — Encounter (HOSPITAL_BASED_OUTPATIENT_CLINIC_OR_DEPARTMENT_OTHER): Payer: Self-pay | Admitting: *Deleted

## 2019-07-16 ENCOUNTER — Ambulatory Visit (INDEPENDENT_AMBULATORY_CARE_PROVIDER_SITE_OTHER): Payer: Commercial Managed Care - PPO | Admitting: Surgical

## 2019-07-16 ENCOUNTER — Encounter: Payer: Self-pay | Admitting: Surgical

## 2019-07-16 VITALS — BP 107/70 | HR 89 | Temp 97.8°F | Ht 62.0 in | Wt 153.0 lb

## 2019-07-16 DIAGNOSIS — C50411 Malignant neoplasm of upper-outer quadrant of right female breast: Secondary | ICD-10-CM

## 2019-07-16 DIAGNOSIS — Z9013 Acquired absence of bilateral breasts and nipples: Secondary | ICD-10-CM

## 2019-07-16 DIAGNOSIS — Z170 Estrogen receptor positive status [ER+]: Secondary | ICD-10-CM

## 2019-07-16 NOTE — Progress Notes (Signed)
   Subjective:     Patient ID: Jasmine [name], female    DOB: 07-20-1966, 53 y.o.   MRN: [id number]  Chief Complaint  Patient presents with  . Follow-up    full prior to surgery    HPI: The patient is a 53 y.o. female here for follow-up on her right breast reconstruction. She is currently scheduled for expander to implant exchange on 07/21/19.   At her pre-op H&P, we discussed putting a little more fluid in her expander on the right to match the left.  Today she is doing well. Had some pain after her last fill but is doing well. Diazepam has been helpful. She would like an additional fill today.  No fevers, chills, n/v. No new wounds. Still has slight small wound on the medial aspect of the superior latissimus flap (right), but it appears superficial. No sign of infection.  We are reluctant to fill more due to her previous complications, wounds and smoking. Do not want the flap incision to open up.  Review of Systems  Constitutional: Negative for chills, diaphoresis, fever, malaise/fatigue and weight loss.  Respiratory: Negative.   Cardiovascular: Negative.   Genitourinary: Negative.   Musculoskeletal: Positive for myalgias.  Skin: Negative for itching and rash.  Neurological: Negative.      Objective:   Vital Signs BP 107/70 (BP Location: Left Arm, Patient Position: Sitting, Cuff Size: Normal)   Pulse 89   Temp 97.8 F (36.6 C) (Temporal)   Ht 5\' 2"  (1.575 m)   Wt 153 lb (69.4 kg)   SpO2 99%   BMI 27.98 kg/m  Vital Signs and Nursing Note Reviewed  Physical Exam  Constitutional: She is oriented to person, place, and time and well-developed, well-nourished, and in no distress. No distress.  HENT:  Head: Normocephalic and atraumatic.  Cardiovascular: Normal rate.  Pulmonary/Chest: Effort normal.  Musculoskeletal: Normal range of motion.  Neurological: She is alert and oriented to person, place, and time. Gait normal.  Skin: Skin is warm and dry. No rash noted. She  is not diaphoretic. No erythema. No pallor.  Psychiatric: Mood and affect normal.      Assessment/Plan:     ICD-10-CM   1. S/P mastectomy, bilateral  Z90.13   2. Acquired absence of breast and absent nipple, bilateral  Z90.13   3. Malignant neoplasm of upper-outer quadrant of right breast in female, estrogen receptor positive ([place])  C50.411    Z17.0     She is doing well. Ready for surgery on 07/21/19.  We placed injectable saline in the Expander using a sterile technique: Right:60cc for a total of 510/ 375cc Left: total of510/ 375cc   Would also like port removed at that time.   Jasmine Rhine Lariya Kinzie, PA-C 07/16/2019, 11:06 AM

## 2019-07-16 NOTE — H&P (View-Only) (Signed)
   Subjective:     Patient ID: Jasmine [name], female    DOB: Apr 22, 1966, 53 y.o.   MRN: [id number]  Chief Complaint  Patient presents with  . Follow-up    full prior to surgery    HPI: The patient is a 53 y.o. female here for follow-up on her right breast reconstruction. She is currently scheduled for expander to implant exchange on 07/21/19.   At her pre-op H&P, we discussed putting a little more fluid in her expander on the right to match the left.  Today she is doing well. Had some pain after her last fill but is doing well. Diazepam has been helpful. She would like an additional fill today.  No fevers, chills, n/v. No new wounds. Still has slight small wound on the medial aspect of the superior latissimus flap (right), but it appears superficial. No sign of infection.  We are reluctant to fill more due to her previous complications, wounds and smoking. Do not want the flap incision to open up.  Review of Systems  Constitutional: Negative for chills, diaphoresis, fever, malaise/fatigue and weight loss.  Respiratory: Negative.   Cardiovascular: Negative.   Genitourinary: Negative.   Musculoskeletal: Positive for myalgias.  Skin: Negative for itching and rash.  Neurological: Negative.      Objective:   Vital Signs BP 107/70 (BP Location: Left Arm, Patient Position: Sitting, Cuff Size: Normal)   Pulse 89   Temp 97.8 F (36.6 C) (Temporal)   Ht 5\' 2"  (1.575 m)   Wt 153 lb (69.4 kg)   SpO2 99%   BMI 27.98 kg/m  Vital Signs and Nursing Note Reviewed  Physical Exam  Constitutional: She is oriented to person, place, and time and well-developed, well-nourished, and in no distress. No distress.  HENT:  Head: Normocephalic and atraumatic.  Cardiovascular: Normal rate.  Pulmonary/Chest: Effort normal.  Musculoskeletal: Normal range of motion.  Neurological: She is alert and oriented to person, place, and time. Gait normal.  Skin: Skin is warm and dry. No rash noted. She  is not diaphoretic. No erythema. No pallor.  Psychiatric: Mood and affect normal.      Assessment/Plan:     ICD-10-CM   1. S/P mastectomy, bilateral  Z90.13   2. Acquired absence of breast and absent nipple, bilateral  Z90.13   3. Malignant neoplasm of upper-outer quadrant of right breast in female, estrogen receptor positive ([place])  C50.411    Z17.0     She is doing well. Ready for surgery on 07/21/19.  We placed injectable saline in the Expander using a sterile technique: Right:60cc for a total of 510/ 375cc Left: total of510/ 375cc   Would also like port removed at that time.   Carola Rhine Cathyann Kilfoyle, PA-C 07/16/2019, 11:06 AM

## 2019-07-17 ENCOUNTER — Other Ambulatory Visit (HOSPITAL_COMMUNITY)
Admission: RE | Admit: 2019-07-17 | Discharge: 2019-07-17 | Disposition: A | Discharge status: Home / Self Care | Payer: Commercial Managed Care - PPO | Source: Ambulatory Visit | Attending: Plastic Surgery | Admitting: Plastic Surgery

## 2019-07-17 DIAGNOSIS — Z01812 Encounter for preprocedural laboratory examination: Secondary | ICD-10-CM | POA: Diagnosis present

## 2019-07-17 DIAGNOSIS — Z20828 Contact with and (suspected) exposure to other viral communicable diseases: Secondary | ICD-10-CM | POA: Insufficient documentation

## 2019-07-17 LAB — SARS CORONAVIRUS 2 (TAT 6-24 HRS): SARS Coronavirus 2: NEGATIVE

## 2019-07-19 ENCOUNTER — Other Ambulatory Visit: Payer: Self-pay | Admitting: Surgical

## 2019-07-19 ENCOUNTER — Other Ambulatory Visit (HOSPITAL_COMMUNITY): Payer: Commercial Managed Care - PPO

## 2019-07-19 MED ORDER — HYDROCODONE-ACETAMINOPHEN 5-325 MG PO TABS: 1.0000 | ORAL_TABLET | Freq: Four times a day (QID) | ORAL | 0 refills | Status: AC | PRN

## 2019-07-19 MED ORDER — ONDANSETRON HCL 4 MG PO TABS: 4.0000 mg | ORAL_TABLET | Freq: Three times a day (TID) | ORAL | 0 refills | Status: AC | PRN

## 2019-07-19 MED ORDER — CEPHALEXIN 500 MG PO CAPS: 500.0000 mg | ORAL_CAPSULE | Freq: Two times a day (BID) | ORAL | 0 refills | Status: AC

## 2019-07-19 NOTE — Progress Notes (Signed)
Medications prior to surgery

## 2019-07-21 ENCOUNTER — Encounter (HOSPITAL_BASED_OUTPATIENT_CLINIC_OR_DEPARTMENT_OTHER)
Admission: RE | Disposition: A | Discharge status: Home / Self Care | Payer: Self-pay | Source: Home / Self Care | Attending: Plastic Surgery

## 2019-07-21 ENCOUNTER — Encounter (HOSPITAL_BASED_OUTPATIENT_CLINIC_OR_DEPARTMENT_OTHER): Payer: Self-pay | Admitting: *Deleted

## 2019-07-21 ENCOUNTER — Ambulatory Visit (HOSPITAL_BASED_OUTPATIENT_CLINIC_OR_DEPARTMENT_OTHER): Payer: Commercial Managed Care - PPO | Admitting: Anesthesiology

## 2019-07-21 ENCOUNTER — Ambulatory Visit (HOSPITAL_BASED_OUTPATIENT_CLINIC_OR_DEPARTMENT_OTHER)
Admission: RE | Admit: 2019-07-21 | Discharge: 2019-07-21 | Disposition: A | Discharge status: Home / Self Care | Payer: Commercial Managed Care - PPO | Attending: Plastic Surgery | Admitting: Plastic Surgery

## 2019-07-21 ENCOUNTER — Other Ambulatory Visit: Payer: Self-pay

## 2019-07-21 DIAGNOSIS — Z853 Personal history of malignant neoplasm of breast: Secondary | ICD-10-CM | POA: Diagnosis not present

## 2019-07-21 DIAGNOSIS — F17200 Nicotine dependence, unspecified, uncomplicated: Secondary | ICD-10-CM | POA: Insufficient documentation

## 2019-07-21 DIAGNOSIS — Z421 Encounter for breast reconstruction following mastectomy: Secondary | ICD-10-CM | POA: Diagnosis not present

## 2019-07-21 DIAGNOSIS — Z90710 Acquired absence of both cervix and uterus: Secondary | ICD-10-CM | POA: Insufficient documentation

## 2019-07-21 DIAGNOSIS — F329 Major depressive disorder, single episode, unspecified: Secondary | ICD-10-CM | POA: Diagnosis not present

## 2019-07-21 DIAGNOSIS — Z452 Encounter for adjustment and management of vascular access device: Secondary | ICD-10-CM | POA: Diagnosis not present

## 2019-07-21 DIAGNOSIS — L905 Scar conditions and fibrosis of skin: Secondary | ICD-10-CM | POA: Insufficient documentation

## 2019-07-21 DIAGNOSIS — Z9013 Acquired absence of bilateral breasts and nipples: Secondary | ICD-10-CM | POA: Diagnosis not present

## 2019-07-21 DIAGNOSIS — F419 Anxiety disorder, unspecified: Secondary | ICD-10-CM | POA: Diagnosis not present

## 2019-07-21 HISTORY — DX: Nicotine dependence, unspecified, uncomplicated: F17.200

## 2019-07-21 HISTORY — PX: PORT-A-CATH REMOVAL: SHX5289

## 2019-07-21 HISTORY — PX: REMOVAL OF BILATERAL TISSUE EXPANDERS WITH PLACEMENT OF BILATERAL BREAST IMPLANTS: SHX6431

## 2019-07-21 SURGERY — REMOVAL, TISSUE EXPANDER, BREAST, BILATERAL, WITH BILATERAL IMPLANT IMPLANT INSERTION
Anesthesia: General | Site: Chest | Laterality: Left

## 2019-07-21 MED ORDER — CHLORHEXIDINE GLUCONATE CLOTH 2 % EX PADS: 6.0000 | MEDICATED_PAD | Freq: Once | CUTANEOUS | Status: DC

## 2019-07-21 MED ORDER — SCOPOLAMINE 1 MG/3DAYS TD PT72: 1.0000 | MEDICATED_PATCH | Freq: Once | TRANSDERMAL | Status: DC

## 2019-07-21 MED ORDER — PROPOFOL 10 MG/ML IV BOLUS
INTRAVENOUS | Status: DC | PRN
  Administered 2019-07-21: 150 mg via INTRAVENOUS

## 2019-07-21 MED ORDER — ACETAMINOPHEN 650 MG RE SUPP: 650.0000 mg | RECTAL | Status: DC | PRN

## 2019-07-21 MED ORDER — CEFAZOLIN SODIUM-DEXTROSE 2-4 GM/100ML-% IV SOLN
2.0000 g | INTRAVENOUS | Status: AC
  Administered 2019-07-21: 08:00:00 2 g via INTRAVENOUS

## 2019-07-21 MED ORDER — ONDANSETRON HCL 4 MG/2ML IJ SOLN
INTRAMUSCULAR | Status: DC | PRN
  Administered 2019-07-21: 4 mg via INTRAVENOUS

## 2019-07-21 MED ORDER — SODIUM CHLORIDE (PF) 0.9 % IJ SOLN
INTRAMUSCULAR | Status: AC
  Filled 2019-07-21: qty 10

## 2019-07-21 MED ORDER — PHENYLEPHRINE HCL (PRESSORS) 10 MG/ML IV SOLN
INTRAVENOUS | Status: AC
  Filled 2019-07-21: qty 1

## 2019-07-21 MED ORDER — ACETAMINOPHEN 325 MG PO TABS: 650.0000 mg | ORAL_TABLET | ORAL | Status: DC | PRN

## 2019-07-21 MED ORDER — SODIUM CHLORIDE 0.9% FLUSH: 3.0000 mL | INTRAVENOUS | Status: DC | PRN

## 2019-07-21 MED ORDER — EPHEDRINE SULFATE 50 MG/ML IJ SOLN
INTRAMUSCULAR | Status: DC | PRN
  Administered 2019-07-21: 10 mg via INTRAVENOUS

## 2019-07-21 MED ORDER — ONDANSETRON HCL 4 MG/2ML IJ SOLN
INTRAMUSCULAR | Status: AC
  Filled 2019-07-21: qty 2

## 2019-07-21 MED ORDER — LACTATED RINGERS IV SOLN
INTRAVENOUS | Status: DC
  Administered 2019-07-21 (×2): via INTRAVENOUS

## 2019-07-21 MED ORDER — MEPERIDINE HCL 25 MG/ML IJ SOLN: 6.2500 mg | INTRAMUSCULAR | Status: DC | PRN

## 2019-07-21 MED ORDER — LIDOCAINE HCL (CARDIAC) PF 100 MG/5ML IV SOSY
PREFILLED_SYRINGE | INTRAVENOUS | Status: DC | PRN
  Administered 2019-07-21: 50 mg via INTRAVENOUS

## 2019-07-21 MED ORDER — SUCCINYLCHOLINE CHLORIDE 200 MG/10ML IV SOSY
PREFILLED_SYRINGE | INTRAVENOUS | Status: AC
  Filled 2019-07-21: qty 10

## 2019-07-21 MED ORDER — PHENYLEPHRINE 40 MCG/ML (10ML) SYRINGE FOR IV PUSH (FOR BLOOD PRESSURE SUPPORT)
PREFILLED_SYRINGE | INTRAVENOUS | Status: AC
  Filled 2019-07-21: qty 10

## 2019-07-21 MED ORDER — SODIUM CHLORIDE 0.9 % IV SOLN: 250.0000 mL | INTRAVENOUS | Status: DC | PRN

## 2019-07-21 MED ORDER — MIDAZOLAM HCL 2 MG/2ML IJ SOLN
1.0000 mg | INTRAMUSCULAR | Status: DC | PRN
  Administered 2019-07-21: 08:00:00 2 mg via INTRAVENOUS

## 2019-07-21 MED ORDER — LIDOCAINE HCL (PF) 1 % IJ SOLN
INTRAMUSCULAR | Status: AC
  Filled 2019-07-21: qty 30

## 2019-07-21 MED ORDER — DEXAMETHASONE SODIUM PHOSPHATE 10 MG/ML IJ SOLN
INTRAMUSCULAR | Status: AC
  Filled 2019-07-21: qty 1

## 2019-07-21 MED ORDER — SODIUM CHLORIDE 0.9 % IV SOLN
INTRAVENOUS | Status: DC | PRN
  Administered 2019-07-21: 40 ug/min via INTRAVENOUS

## 2019-07-21 MED ORDER — SODIUM CHLORIDE 0.9 % IV SOLN
INTRAVENOUS | Status: DC | PRN
  Administered 2019-07-21: 500 mL

## 2019-07-21 MED ORDER — CEFAZOLIN SODIUM-DEXTROSE 2-4 GM/100ML-% IV SOLN
INTRAVENOUS | Status: AC
  Filled 2019-07-21: qty 100

## 2019-07-21 MED ORDER — FENTANYL CITRATE (PF) 100 MCG/2ML IJ SOLN
INTRAMUSCULAR | Status: AC
  Filled 2019-07-21: qty 2

## 2019-07-21 MED ORDER — BUPIVACAINE-EPINEPHRINE 0.25% -1:200000 IJ SOLN
INTRAMUSCULAR | Status: DC | PRN
  Administered 2019-07-21: 20 mL

## 2019-07-21 MED ORDER — DIPHENHYDRAMINE HCL 50 MG/ML IJ SOLN
INTRAMUSCULAR | Status: DC | PRN
  Administered 2019-07-21: 12.5 mg via INTRAVENOUS

## 2019-07-21 MED ORDER — METOCLOPRAMIDE HCL 5 MG/ML IJ SOLN: 10.0000 mg | Freq: Once | INTRAMUSCULAR | Status: DC | PRN

## 2019-07-21 MED ORDER — FENTANYL CITRATE (PF) 100 MCG/2ML IJ SOLN
25.0000 ug | INTRAMUSCULAR | Status: DC | PRN
  Administered 2019-07-21 (×2): 25 ug via INTRAVENOUS

## 2019-07-21 MED ORDER — LIDOCAINE 2% (20 MG/ML) 5 ML SYRINGE
INTRAMUSCULAR | Status: AC
  Filled 2019-07-21: qty 5

## 2019-07-21 MED ORDER — SUGAMMADEX SODIUM 500 MG/5ML IV SOLN
INTRAVENOUS | Status: DC | PRN
  Administered 2019-07-21: 200 mg via INTRAVENOUS

## 2019-07-21 MED ORDER — EPHEDRINE 5 MG/ML INJ
INTRAVENOUS | Status: AC
  Filled 2019-07-21: qty 10

## 2019-07-21 MED ORDER — FENTANYL CITRATE (PF) 100 MCG/2ML IJ SOLN
50.0000 ug | INTRAMUSCULAR | Status: DC | PRN
  Administered 2019-07-21: 09:00:00 100 ug via INTRAVENOUS

## 2019-07-21 MED ORDER — OXYCODONE HCL 5 MG PO TABS
ORAL_TABLET | ORAL | Status: AC
  Filled 2019-07-21: qty 1

## 2019-07-21 MED ORDER — MIDAZOLAM HCL 2 MG/2ML IJ SOLN
INTRAMUSCULAR | Status: AC
  Filled 2019-07-21: qty 2

## 2019-07-21 MED ORDER — ROCURONIUM BROMIDE 100 MG/10ML IV SOLN
INTRAVENOUS | Status: DC | PRN
  Administered 2019-07-21: 80 mg via INTRAVENOUS

## 2019-07-21 MED ORDER — SODIUM CHLORIDE 0.9% FLUSH: 3.0000 mL | Freq: Two times a day (BID) | INTRAVENOUS | Status: DC

## 2019-07-21 MED ORDER — OXYCODONE HCL 5 MG PO TABS
5.0000 mg | ORAL_TABLET | ORAL | Status: DC | PRN
  Administered 2019-07-21: 11:00:00 5 mg via ORAL

## 2019-07-21 MED ORDER — BUPIVACAINE-EPINEPHRINE 0.25% -1:200000 IJ SOLN
INTRAMUSCULAR | Status: AC
  Filled 2019-07-21: qty 1

## 2019-07-21 MED ORDER — LACTATED RINGERS IV SOLN: INTRAVENOUS | Status: DC

## 2019-07-21 MED ORDER — BUPIVACAINE-EPINEPHRINE (PF) 0.25% -1:200000 IJ SOLN
INTRAMUSCULAR | Status: AC
  Filled 2019-07-21: qty 30

## 2019-07-21 MED ORDER — EPINEPHRINE PF 1 MG/ML IJ SOLN
INTRAMUSCULAR | Status: AC
  Filled 2019-07-21: qty 1

## 2019-07-21 SURGICAL SUPPLY — 76 items
ADH SKN CLS APL DERMABOND .7 (GAUZE/BANDAGES/DRESSINGS) ×2
APL PRP STRL LF DISP 70% ISPRP (MISCELLANEOUS) ×1
BAG DECANTER FOR FLEXI CONT (MISCELLANEOUS) ×4 IMPLANT
BINDER BREAST LRG (GAUZE/BANDAGES/DRESSINGS) IMPLANT
BINDER BREAST MEDIUM (GAUZE/BANDAGES/DRESSINGS) IMPLANT
BINDER BREAST XLRG (GAUZE/BANDAGES/DRESSINGS) IMPLANT
BINDER BREAST XXLRG (GAUZE/BANDAGES/DRESSINGS) IMPLANT
BIOPATCH RED 1 DISK 7.0 (GAUZE/BANDAGES/DRESSINGS) IMPLANT
BIOPATCH RED 1IN DISK 7.0MM (GAUZE/BANDAGES/DRESSINGS)
BLADE HEX COATED 2.75 (ELECTRODE) ×4 IMPLANT
BLADE SURG 15 STRL LF DISP TIS (BLADE) ×4 IMPLANT
BLADE SURG 15 STRL SS (BLADE) ×4
CANISTER SUCT 1200ML W/VALVE (MISCELLANEOUS) ×4 IMPLANT
CHLORAPREP W/TINT 26 (MISCELLANEOUS) ×4 IMPLANT
CORD BIPOLAR FORCEPS 12FT (ELECTRODE) IMPLANT
COVER BACK TABLE REUSABLE LG (DRAPES) ×4 IMPLANT
COVER MAYO STAND REUSABLE (DRAPES) ×4 IMPLANT
COVER WAND RF STERILE (DRAPES) IMPLANT
DECANTER SPIKE VIAL GLASS SM (MISCELLANEOUS) IMPLANT
DERMABOND ADVANCED (GAUZE/BANDAGES/DRESSINGS) ×4
DERMABOND ADVANCED .7 DNX12 (GAUZE/BANDAGES/DRESSINGS) ×4 IMPLANT
DRAIN CHANNEL 19F RND (DRAIN) IMPLANT
DRAPE LAPAROSCOPIC ABDOMINAL (DRAPES) ×4 IMPLANT
DRSG PAD ABDOMINAL 8X10 ST (GAUZE/BANDAGES/DRESSINGS) ×8 IMPLANT
ELECT BLADE 4.0 EZ CLEAN MEGAD (MISCELLANEOUS) ×4
ELECT REM PT RETURN 9FT ADLT (ELECTROSURGICAL) ×4
ELECTRODE BLDE 4.0 EZ CLN MEGD (MISCELLANEOUS) ×2 IMPLANT
ELECTRODE REM PT RTRN 9FT ADLT (ELECTROSURGICAL) ×2 IMPLANT
EVACUATOR SILICONE 100CC (DRAIN) IMPLANT
GAUZE SPONGE 4X4 12PLY STRL LF (GAUZE/BANDAGES/DRESSINGS) IMPLANT
GLOVE BIO SURGEON STRL SZ 6.5 (GLOVE) ×12 IMPLANT
GLOVE BIO SURGEON STRL SZ7 (GLOVE) ×4 IMPLANT
GLOVE BIO SURGEONS STRL SZ 6.5 (GLOVE) ×4
GLOVE BIOGEL PI IND STRL 7.0 (GLOVE) ×2 IMPLANT
GLOVE BIOGEL PI INDICATOR 7.0 (GLOVE) ×2
GOWN STRL REUS W/ TWL LRG LVL3 (GOWN DISPOSABLE) ×4 IMPLANT
GOWN STRL REUS W/TWL LRG LVL3 (GOWN DISPOSABLE) ×6
IMPL BREAST GEL 535CC (Breast) ×2 IMPLANT
IMPL BREAST SMOOTH UH 430CC (Breast) ×2 IMPLANT
IMPLANT BREAST GEL 535CC (Breast) ×4 IMPLANT
IMPLANT BREAST SMOOTH UH 430CC (Breast) ×4 IMPLANT
IV NS 1000ML (IV SOLUTION)
IV NS 1000ML BAXH (IV SOLUTION) IMPLANT
IV NS 500ML (IV SOLUTION)
IV NS 500ML BAXH (IV SOLUTION) IMPLANT
KIT FILL SYSTEM UNIVERSAL (SET/KITS/TRAYS/PACK) IMPLANT
NDL SAFETY ECLIPSE 18X1.5 (NEEDLE) ×2 IMPLANT
NEEDLE HYPO 18GX1.5 SHARP (NEEDLE) ×2
NEEDLE HYPO 25X1 1.5 SAFETY (NEEDLE) ×4 IMPLANT
PACK BASIN DAY SURGERY FS (CUSTOM PROCEDURE TRAY) ×4 IMPLANT
PENCIL BUTTON HOLSTER BLD 10FT (ELECTRODE) ×4 IMPLANT
PIN SAFETY STERILE (MISCELLANEOUS) IMPLANT
SIZER BREAST GEL REUSE 430CC (SIZER) ×4
SIZER BREAST GEL REUSE 480CC (SIZER) ×4
SIZER BREAST REUSE 535CC (SIZER) ×2
SIZER BRST GEL REUSE 430CC (SIZER) ×2 IMPLANT
SIZER BRST GEL REUSE 480CC (SIZER) ×2 IMPLANT
SIZER BRST REUSE ULT HI 535CC (SIZER) ×2 IMPLANT
SLEEVE SCD COMPRESS KNEE MED (MISCELLANEOUS) ×4 IMPLANT
SPONGE LAP 18X18 RF (DISPOSABLE) ×16 IMPLANT
STRIP SUTURE WOUND CLOSURE 1/2 (SUTURE) IMPLANT
SUT MNCRL AB 4-0 PS2 18 (SUTURE) ×12 IMPLANT
SUT MON AB 3-0 SH 27 (SUTURE) ×6
SUT MON AB 3-0 SH27 (SUTURE) ×6 IMPLANT
SUT MON AB 5-0 PS2 18 (SUTURE) ×8 IMPLANT
SUT PDS AB 2-0 CT2 27 (SUTURE) IMPLANT
SUT VIC AB 3-0 SH 27 (SUTURE)
SUT VIC AB 3-0 SH 27X BRD (SUTURE) IMPLANT
SUT VICRYL 4-0 PS2 18IN ABS (SUTURE) IMPLANT
SYR BULB IRRIGATION 50ML (SYRINGE) ×4 IMPLANT
SYR CONTROL 10ML LL (SYRINGE) ×4 IMPLANT
TOWEL GREEN STERILE FF (TOWEL DISPOSABLE) ×8 IMPLANT
TUBE CONNECTING 20'X1/4 (TUBING) ×1
TUBE CONNECTING 20X1/4 (TUBING) ×3 IMPLANT
UNDERPAD 30X36 HEAVY ABSORB (UNDERPADS AND DIAPERS) ×8 IMPLANT
YANKAUER SUCT BULB TIP NO VENT (SUCTIONS) ×4 IMPLANT

## 2019-07-21 NOTE — Anesthesia Procedure Notes (Signed)
Procedure Name: Intubation Date/Time: 07/21/2019 8:34 AM Performed by: Willa Frater, CRNA Pre-anesthesia Checklist: Patient identified, Emergency Drugs available, Suction available and Patient being monitored Patient Re-evaluated:Patient Re-evaluated prior to induction Oxygen Delivery Method: Circle system utilized Preoxygenation: Pre-oxygenation with 100% oxygen Induction Type: IV induction Ventilation: Mask ventilation without difficulty Laryngoscope Size: Mac and 3 Grade View: Grade I Tube type: Oral Tube size: 7.0 mm Number of attempts: 1 Airway Equipment and Method: Stylet and Oral airway Placement Confirmation: ETT inserted through vocal cords under direct vision,  positive ETCO2 and breath sounds checked- equal and bilateral Secured at: 20 cm Tube secured with: Tape Dental Injury: Teeth and Oropharynx as per pre-operative assessment

## 2019-07-21 NOTE — Anesthesia Postprocedure Evaluation (Signed)
Anesthesia Post Note  Patient: Jasmine [name]  Procedure(s) Performed: REMOVAL OF BILATERAL TISSUE EXPANDERS WITH PLACEMENT OF BILATERAL BREAST IMPLANTS (Bilateral Breast) REMOVAL PORT-A-CATH (Left Chest)     Patient location during evaluation: PACU Anesthesia Type: General Level of consciousness: awake and alert Pain management: pain level controlled Vital Signs Assessment: post-procedure vital signs reviewed and stable Respiratory status: spontaneous breathing, nonlabored ventilation, respiratory function stable and patient connected to nasal cannula oxygen Cardiovascular status: blood pressure returned to baseline and stable Postop Assessment: no apparent nausea or vomiting Anesthetic complications: no    Last Vitals:  Vitals:   07/21/19 1030 07/21/19 1045  BP: 137/72 129/72  Pulse: 88 88  Resp: 20 16  Temp:    SpO2: 100% 95%    Last Pain:  Vitals:   07/21/19 1016  TempSrc:   PainSc: Asleep                 Montez Hageman

## 2019-07-21 NOTE — Anesthesia Preprocedure Evaluation (Signed)
Anesthesia Evaluation  Patient identified by MRN, date of birth, ID band Patient awake    Reviewed: Allergy & Precautions, NPO status , Patient's Chart, lab work & pertinent test results  History of Anesthesia Complications Negative for: history of anesthetic complications  Airway Mallampati: II  TM Distance: >3 FB Neck ROM: Full    Dental no notable dental hx. (+) Dental Advisory Given, Partial Upper   Pulmonary Current Smoker,    Pulmonary exam normal breath sounds clear to auscultation       Cardiovascular negative cardio ROS Normal cardiovascular exam Rhythm:Regular Rate:Normal     Neuro/Psych PSYCHIATRIC DISORDERS Anxiety Depression negative neurological ROS     GI/Hepatic negative GI ROS, Neg liver ROS,   Endo/Other  negative endocrine ROS  Renal/GU negative Renal ROS     Musculoskeletal negative musculoskeletal ROS (+)   Abdominal   Peds  Hematology negative hematology ROS (+)   Anesthesia Other Findings   Reproductive/Obstetrics  Breast cancer S/p hysterectomy                             Anesthesia Physical  Anesthesia Plan  ASA: II  Anesthesia Plan: General   Post-op Pain Management:    Induction: Intravenous  PONV Risk Score and Plan: 4 or greater and Treatment may vary due to age or medical condition, Ondansetron, Midazolam, Dexamethasone and Scopolamine patch - Pre-op  Airway Management Planned: Oral ETT and LMA  Additional Equipment: None  Intra-op Plan:   Post-operative Plan: Extubation in OR  Informed Consent: I have reviewed the patients History and Physical, chart, labs and discussed the procedure including the risks, benefits and alternatives for the proposed anesthesia with the patient or authorized representative who has indicated his/her understanding and acceptance.     Dental advisory given  Plan Discussed with: CRNA and  Anesthesiologist  Anesthesia Plan Comments:         Anesthesia Quick Evaluation

## 2019-07-21 NOTE — Discharge Instructions (Signed)
INSTRUCTIONS FOR AFTER SURGERY   You are having surgery.  You will likely have some questions about what to expect following your operation.  The following information will help you and your family understand what to expect when you are discharged from the hospital.  Following these guidelines will help ensure a smooth recovery and reduce risks of complications.   Postoperative instructions include information on: diet, wound care, medications and physical activity.  AFTER SURGERY Expect to go home after the procedure.  In some cases, you may need to spend one night in the hospital for observation.  DIET This surgery does not require a specific diet.  However, I have to mention that the healthier you eat the better your body can start healing. It is important to increasing your protein intake.  This means limiting the foods with sugar and carbohydrates.  Focus on vegetables and some meat.  If you have any liposuction during your procedure be sure to drink water.  If your urine is bright yellow, then it is concentrated, and you need to drink more water.  As a general rule after surgery, you should have 8 ounces of water every hour while awake.  If you find you are persistently nauseated or unable to take in liquids let us know.  NO TOBACCO USE or EXPOSURE.  This will slow your healing process and increase the risk of a wound.  WOUND CARE If you don't have a drain: You can shower the day after surgery if you don't have a drain.  Use fragrance free soap.  Dial, [place] and [place] are usually mild on the skin.  If you have a drain: Clean with baby wipes until the drain is removed.   If you have steri-strips / tape directly attached to your skin leave them in place. It is OK to get these wet.  No baths, pools or hot tubs for two weeks. We close your incision to leave the smallest and best-looking scar. No ointment or creams on your incisions until given the go ahead.  Especially not Neosporin (Too many skin  reactions with this one).  A few weeks after surgery you can use Mederma and start massaging the scar. We ask you to wear your binder or sports bra for the first 6 weeks around the clock, including while sleeping. This provides added comfort and helps reduce the fluid accumulation at the surgery site.  ACTIVITY No heavy lifting until cleared by the doctor.  It is OK to walk and climb stairs. In fact, moving your legs is very important to decrease your risk of a blood clot.  It will also help keep you from getting deconditioned.  Every 1 to 2 hours get up and walk for 5 minutes. This will help with a quicker recovery back to normal.  Let pain be your guide so you don't do too much.  NO, you cannot do the spring cleaning and don't plan on taking care of anyone else.  This is your time for TLC.  You will be more comfortable if you sleep and rest with your head elevated either with a few pillows under you or in a recliner.  No stomach sleeping for a few months.  WORK Everyone returns to work at different times. As a rough guide, most people take at least 1 - 2 weeks off prior to returning to work. If you need documentation for your job, bring the forms to your postoperative follow up visit.  DRIVING Arrange for someone to  bring you home from the hospital.  You may be able to drive a few days after surgery but not while taking any narcotics or valium.  BOWEL MOVEMENTS Constipation can occur after anesthesia and while taking pain medication.  It is important to stay ahead for your comfort.  We recommend taking Milk of Magnesia (2 tablespoons; twice a day) while taking the pain pills.  SEROMA This is fluid your body tried to put in the surgical site.  This is normal but if it creates tight skinny skin let us know.  It usually decreases in a few weeks.  WHEN TO CALL Call your surgeon's office if any of the following occur:  Fever 101 degrees F or greater  Excessive bleeding or fluid from the incision  site.  Pain that increases over time without aid from the medications  Redness, warmth, or pus draining from incision sites  Persistent nausea or inability to take in liquids  Severe misshapen area that underwent the operation.    Post Anesthesia Home Care Instructions  Activity: Get plenty of rest for the remainder of the day. A responsible individual must stay with you for 24 hours following the procedure.  For the next 24 hours, DO NOT: -Drive a car -Paediatric nurse -Drink alcoholic beverages -Take any medication unless instructed by your physician -Make any legal decisions or sign important papers.  Meals: Start with liquid foods such as gelatin or soup. Progress to regular foods as tolerated. Avoid greasy, spicy, heavy foods. If nausea and/or vomiting occur, drink only clear liquids until the nausea and/or vomiting subsides. Call your physician if vomiting continues.  Special Instructions/Symptoms: Your throat may feel dry or sore from the anesthesia or the breathing tube placed in your throat during surgery. If this causes discomfort, gargle with warm salt water. The discomfort should disappear within 24 hours.  If you had a scopolamine patch placed behind your ear for the management of post- operative nausea and/or vomiting:  1. The medication in the patch is effective for 72 hours, after which it should be removed.  Wrap patch in a tissue and discard in the trash. Wash hands thoroughly with soap and water. 2. You may remove the patch earlier than 72 hours if you experience unpleasant side effects which may include dry mouth, dizziness or visual disturbances. 3. Avoid touching the patch. Wash your hands with soap and water after contact with the patch.

## 2019-07-21 NOTE — Transfer of Care (Signed)
Immediate Anesthesia Transfer of Care Note  Patient: Jasmine [name]  Procedure(s) Performed: REMOVAL OF BILATERAL TISSUE EXPANDERS WITH PLACEMENT OF BILATERAL BREAST IMPLANTS (Bilateral Breast) REMOVAL PORT-A-CATH (Left Chest)  Patient Location: PACU  Anesthesia Type:General  Level of Consciousness: awake, alert , oriented and drowsy  Airway & Oxygen Therapy: Patient Spontanous Breathing and Patient connected to face mask oxygen  Post-op Assessment: Report given to RN and Post -op Vital signs reviewed and stable  Post vital signs: Reviewed and stable  Last Vitals:   Vitals Value Taken Time  BP    Temp    Pulse    Resp    SpO2      Last Pain:  Vitals:   07/21/19 0736  TempSrc: Oral  PainSc: 0-No pain      Patients Stated Pain Goal: 3 [phone] [phone])  Complications: No apparent anesthesia complications

## 2019-07-21 NOTE — Interval H&P Note (Signed)
History and Physical Interval Note:  07/21/2019 8:00 AM  Jasmine [name]  has presented today for surgery, with the diagnosis of acquired absence of right breast, history of breast cancer.  The various methods of treatment have been discussed with the patient and family. After consideration of risks, benefits and other options for treatment, the patient has consented to  Procedure(s) with comments: REMOVAL OF BILATERAL TISSUE EXPANDERS WITH PLACEMENT OF BILATERAL BREAST IMPLANTS (Bilateral) - 2.5 hours, please as a surgical intervention.  The patient's history has been reviewed, patient examined, no change in status, stable for surgery.  I have reviewed the patient's chart and labs.  Questions were answered to the patient's satisfaction.     Loel Lofty Vasil Juhasz

## 2019-07-21 NOTE — Op Note (Addendum)
Op report Bilateral Exchange   DATE OF OPERATION: 07/21/2019  LOCATION: [facility]  SURGICAL DIVISION: Plastic Surgery  PREOPERATIVE DIAGNOSES:  1.History of breast cancer.  2. Acquired absence of bilateral breast.   POSTOPERATIVE DIAGNOSES:  1. History of breast cancer.  2. Acquired absence of bilateral breast.   PROCEDURE:  1. Bilateral exchange of tissue expanders for implants.  2. Bilateral capsulotomies for implant respositioning. 3. Removal of the port-a-cath.  SURGEON:  [place] , DO  ASSISTANT: [place], RNFA and Roetta Sessions, [place]  ANESTHESIA:  General.   COMPLICATIONS: None.   IMPLANTS: Left - Mentor Smooth Round Ultra High Profile Gel 430cc. Ref [id number].  Serial Number [id number] - Mentor Smooth Round Ultra High Profile Gel 535cc. Ref [id number].  Serial Number [id number]  INDICATIONS FOR PROCEDURE:  The patient, Jasmine [name], is a 53 y.o. female born on Mar 29, 1966, is here for treatment after bilateral mastectomies.  She had tissue expanders placed at the time of mastectomies. She now presents for exchange of her expanders for implants.  She requires capsulotomies to better position the implants. MRN: [id number]  CONSENT:  Informed consent was obtained directly from the patient. Risks, benefits and alternatives were fully discussed. Specific risks including but not limited to bleeding, infection, hematoma, seroma, scarring, pain, implant infection, implant extrusion, capsular contracture, asymmetry, wound healing problems, and need for further surgery were all discussed. The patient did have an ample opportunity to have her questions answered to her satisfaction.   DESCRIPTION OF PROCEDURE:  The patient was taken to the operating room. SCDs were placed and IV antibiotics were given. The patient's chest was prepped and draped in a sterile fashion. A time out was performed and the implants to be used were  identified.    On the right breast: One percent Lidocaine with epinephrine was used to infiltrate at the incision site. The inferior latissimus flap scar was incised.  The dissection was carried down to the inframammary fold.  The muscle was released from the fold.  The fluid was evacuated from the expander and then it was removed.   Inspection of the pocket showed a normal healthy capsule.  The pocket was irrigated with antibiotic solution.  Circumferential capsulotomies were performed to allow for breast pocket expansion.  Measurements were made and a sizer used to confirm adequate pocket size for the implant dimensions.  Hemostasis was ensured with electrocautery. New gloves were placed. The implant was soaked in antibiotic solution and then placed in the pocket and oriented appropriately. The muscle and capsule on the anterior surface were re-closed with a 3-0 Monocryl suture. The remaining skin was closed with 4-0 Monocryl deep dermal and 5-0 Monocryl subcuticular stitches.   On the left breast: The old mastectomy scar was incised.  The mastectomy flaps from the superior and inferior flaps were raised over the pectoralis major muscle for several centimeters to minimize tension for the closure. The pectoralis was split inferior to the skin incision to expose and remove the tissue expander.  Inspection of the pocket showed a normal healthy capsule and integration of the biologic matrix.   Circumferential capsulotomies were performed to allow for breast pocket expansion.  Measurements were made and a sizer utilized to confirm adequate pocket size for the implant dimensions.  Hemostasis was ensured with the electrocautery.  New gloves were applied. The implant was soaked in antibiotic solution and placed in the pocket and oriented appropriately. The pectoralis major muscle and capsule on the anterior surface  were re-closed with a 3-0 Monocryl suture. The remaining skin was closed with 4-0 Monocryl deep  dermal and 5-0 Monocryl subcuticular stitches.   The incision at the port-a-cath site was injected with local.  The #10 blade was used to excise the scar.  The bovie was used to dissect to the device.  The device was released from the surrounding tissue.  A 3-0 Vicryl was used to encircle the catheter insertion site.  The patient was placed in trendelenburg.  The catheter was removed as the stitch was cinched.  The deep layers was closed with the 4-0 Monocryl followed by the 5-0 Monocryl.  Dermabond was applied to all the incision sites. A breast binder and ABDs were placed.  The patient was allowed to wake from anesthesia and taken to the recovery room in satisfactory condition.   The advanced practice practitioner (APP) assisted throughout the case.  The APP was essential in retraction and counter traction when needed to make the case progress smoothly.  This retraction and assistance made it possible to see the tissue plans for the procedure.  The assistance was needed for blood control, tissue re-approximation and assisted with closure of the incision site.

## 2019-07-22 ENCOUNTER — Encounter (HOSPITAL_BASED_OUTPATIENT_CLINIC_OR_DEPARTMENT_OTHER): Payer: Self-pay | Admitting: Plastic Surgery

## 2019-07-29 ENCOUNTER — Telehealth: Payer: Self-pay

## 2019-07-29 NOTE — Telephone Encounter (Signed)

## 2019-07-30 ENCOUNTER — Ambulatory Visit (INDEPENDENT_AMBULATORY_CARE_PROVIDER_SITE_OTHER): Payer: Commercial Managed Care - PPO | Admitting: Plastic Surgery

## 2019-07-30 ENCOUNTER — Other Ambulatory Visit: Payer: Self-pay

## 2019-07-30 ENCOUNTER — Encounter: Payer: Self-pay | Admitting: Plastic Surgery

## 2019-07-30 VITALS — BP 110/74 | HR 81 | Temp 98.3°F | Ht 62.0 in | Wt 154.0 lb

## 2019-07-30 DIAGNOSIS — Z9013 Acquired absence of bilateral breasts and nipples: Secondary | ICD-10-CM

## 2019-07-30 DIAGNOSIS — Z170 Estrogen receptor positive status [ER+]: Secondary | ICD-10-CM

## 2019-07-30 DIAGNOSIS — C50411 Malignant neoplasm of upper-outer quadrant of right female breast: Secondary | ICD-10-CM

## 2019-07-30 DIAGNOSIS — Z9011 Acquired absence of right breast and nipple: Secondary | ICD-10-CM

## 2019-07-30 NOTE — Progress Notes (Signed)
   Subjective:    Patient ID: Jasmine [name], female    DOB: 1966/07/09, 53 y.o.   MRN: [id number]  The patient is a 53 year old female here for follow-up on her breast reconstruction.  Overall she is doing very well.  All incisions are healing well.  I do not see any sign of infection or fluid retention.  No sign of hematoma.  She is still smoking.    Review of Systems  Constitutional: Negative.   HENT: Negative.   Eyes: Negative.   Respiratory: Negative.   Cardiovascular: Negative.   Endocrine: Negative.   Genitourinary: Negative.   Musculoskeletal: Negative.   Neurological: Negative.   Hematological: Negative.   Psychiatric/Behavioral: Negative.        Objective:   Physical Exam Vitals signs and nursing note reviewed.  Constitutional:      Appearance: Normal appearance.  HENT:     Head: Normocephalic and atraumatic.  Cardiovascular:     Rate and Rhythm: Normal rate.     Pulses: Normal pulses.  Pulmonary:     Effort: Pulmonary effort is normal.  Neurological:     General: No focal deficit present.     Mental Status: She is alert and oriented to person, place, and time.  Psychiatric:        Mood and Affect: Mood normal.        Behavior: Behavior normal.          Assessment & Plan:     ICD-10-CM   1. S/P mastectomy, bilateral  Z90.13   2. Malignant neoplasm of upper-outer quadrant of right breast in female, estrogen receptor positive ([place])  C50.411    Z17.0   3. Acquired absence of right breast  Z90.11   4. Acquired absence of breast and absent nipple, bilateral  Z90.13     Continue with breast binder or sports bra.  Can start with a little bit of massage.  Leave the Steri-Strip on for another week or until it falls off whatever comes first.  Start Mederma in 2 weeks as desired.  Follow-up in 2 to 3 months.  She may be interested in nipple areole a tattoo.

## 2019-08-12 NOTE — Progress Notes (Signed)
No show

## 2019-08-13 ENCOUNTER — Ambulatory Visit: Payer: Commercial Managed Care - PPO | Admitting: Surgical

## 2019-08-19 ENCOUNTER — Telehealth: Payer: Self-pay

## 2019-08-19 NOTE — Telephone Encounter (Signed)

## 2019-08-19 NOTE — Progress Notes (Signed)
Subjective:     Patient ID: Jasmine [name], female    DOB: [birth year], 52 y.o.   MRN: [id number]  Chief Complaint  Patient presents with  . Follow-up    on breast reconstruction    HPI: The patient is a 53 y.o. female here for follow-up on her breast reconstruction.  She is 1 month post op from removal of bilateral tissue expanders with placement of implants.  She had some questions about right lateral axillary excess tissue/skin and about the right breast being more ptotic than the left. She has a right latissimus flap which is providing extra skin due to previous break down of tissue/incision after chemotherapy. The excess tissue/skin is a combination of normal lateral fat, the latissimus muscle tracking from the back to the breast and minor post-op swelling.   She is still smoking 5 cigarettes per day. No fevers, chills, n/v. Incisions are healing well without any drainage, surrounding erythema. No sign of seroma, hematoma.  She had some questions about future surgical interventions to increase breast size on the right or to make the right more symmetrical with the left.   She is returning to work on Monday, she works on [employer].  Review of Systems  Constitutional: Negative for chills, diaphoresis, fever, malaise/fatigue and weight loss.  Respiratory: Negative for cough and shortness of breath.   Cardiovascular: Negative for chest pain, palpitations and leg swelling.  Gastrointestinal: Negative for abdominal pain, diarrhea, nausea and vomiting.  Genitourinary: Negative for dysuria.  Musculoskeletal: Negative for myalgias.  Skin: Negative for itching and rash.  Neurological: Negative for dizziness, weakness and headaches.     Objective:   Vital Signs BP 100/66 (BP Location: Left Arm, Patient Position: Sitting, Cuff Size: Normal)   Pulse 91   Temp (!) 94.1 F (34.5 C) (Temporal)   Ht 5\' 2"  (1.575 m)   Wt 153 lb (69.4 kg)   SpO2 99%   BMI 27.98 kg/m  Vital  Signs and Nursing Note Reviewed Chaperone present Physical Exam  Constitutional: She is oriented to person, place, and time and well-developed, well-nourished, and in no distress.  HENT:  Head: Normocephalic and atraumatic.  Neck: Normal range of motion.  Cardiovascular: Normal rate.  Pulmonary/Chest: Effort normal.    Incisions are c/d/i bilaterally. No dehiscence, drainage noted. No erythema. Port-a-cath incision is slightly erythematous at the tips of the incision where sutures were protruding. No drainage.  Musculoskeletal:        General: No tenderness, deformity or edema.  Neurological: She is alert and oriented to person, place, and time. Gait normal.  Skin: Skin is warm and dry. No rash noted. She is not diaphoretic. No erythema. No pallor.  Psychiatric: Mood and affect normal.      Assessment/Plan:     ICD-10-CM   1. S/P mastectomy, bilateral  Z90.13   2. Malignant neoplasm of upper-outer quadrant of right breast in female, estrogen receptor positive ([place])  C50.411    Z17.0     Continue to wear sports bra for a minimum of 3 more weeks then transition into a normal bra without an underwire. She can continue wearing sports bra if they are more comfortable. Avoid bra with underwire at this time.   No fevers, chills, n/v. Healing well. No concerns.  She may be interested in lipo-filling in the future for the right breast asymmetry and possible NAC tattoo.   Follow up in 1 month, call with questions or concerns prior to then.   Rodman Key  J Eleanor Gatliff, PA-C 08/20/2019, 12:10 PM

## 2019-08-20 ENCOUNTER — Encounter: Payer: Self-pay | Admitting: Surgical

## 2019-08-20 ENCOUNTER — Other Ambulatory Visit: Payer: Self-pay

## 2019-08-20 ENCOUNTER — Ambulatory Visit (INDEPENDENT_AMBULATORY_CARE_PROVIDER_SITE_OTHER): Payer: Commercial Managed Care - PPO | Admitting: Surgical

## 2019-08-20 ENCOUNTER — Encounter: Payer: Self-pay | Admitting: Plastic Surgery

## 2019-08-20 VITALS — BP 100/66 | HR 91 | Temp 94.1°F | Ht 62.0 in | Wt 153.0 lb

## 2019-08-20 DIAGNOSIS — C50411 Malignant neoplasm of upper-outer quadrant of right female breast: Secondary | ICD-10-CM

## 2019-08-20 DIAGNOSIS — Z9013 Acquired absence of bilateral breasts and nipples: Secondary | ICD-10-CM

## 2019-08-20 DIAGNOSIS — Z170 Estrogen receptor positive status [ER+]: Secondary | ICD-10-CM

## 2019-10-01 ENCOUNTER — Ambulatory Visit (INDEPENDENT_AMBULATORY_CARE_PROVIDER_SITE_OTHER): Payer: Commercial Managed Care - PPO | Admitting: Plastic Surgery

## 2019-10-01 ENCOUNTER — Other Ambulatory Visit: Payer: Self-pay

## 2019-10-01 ENCOUNTER — Encounter: Payer: Self-pay | Admitting: Plastic Surgery

## 2019-10-01 DIAGNOSIS — Z9011 Acquired absence of right breast and nipple: Secondary | ICD-10-CM

## 2019-10-01 DIAGNOSIS — Z9013 Acquired absence of bilateral breasts and nipples: Secondary | ICD-10-CM

## 2019-10-01 DIAGNOSIS — T8149XA Infection following a procedure, other surgical site, initial encounter: Secondary | ICD-10-CM

## 2019-10-01 MED ORDER — TRIAMCINOLONE ACETONIDE 0.1 % EX CREA: 1.0000 "application " | TOPICAL_CREAM | Freq: Two times a day (BID) | CUTANEOUS | 0 refills | Status: AC

## 2019-10-01 NOTE — Progress Notes (Signed)
   Subjective:    Patient ID: Jasmine [name], female    DOB: 1966/09/24, 53 y.o.   MRN: [id number]  The patient is a 53 year old female here for follow-up after undergoing bilateral breast reconstruction.  She had a right latissimus muscle flap and a left expander to implant.  She has an implant on the right as well.  This is the best I have seen her in several years.  She seems to be doing extremely well.  There is no sign of infection.  There is no redness and no sign of seroma.  She has some itching at the port site.  The right implant does move slightly laterally.  This gives some hollowing in the superior medial chest area.  This could be corrected with fat grafting.     Review of Systems  Constitutional: Negative.   HENT: Negative.   Eyes: Negative.   Respiratory: Negative.   Cardiovascular: Negative.   Genitourinary: Negative.   Musculoskeletal: Negative.   Hematological: Negative.        Objective:   Physical Exam Vitals signs and nursing note reviewed.  Constitutional:      Appearance: Normal appearance.  HENT:     Head: Normocephalic and atraumatic.  Cardiovascular:     Rate and Rhythm: Normal rate.     Pulses: Normal pulses.  Pulmonary:     Effort: Pulmonary effort is normal.  Abdominal:     General: Abdomen is flat. There is no distension.  Neurological:     General: No focal deficit present.     Mental Status: She is alert and oriented to person, place, and time.  Psychiatric:        Mood and Affect: Mood normal.        Behavior: Behavior normal.        Thought Content: Thought content normal.           Assessment & Plan:     ICD-10-CM   1. Postoperative wound infection  T81.49XA   2. Acquired absence of right breast  Z90.11   3. Acquired absence of breast and absent nipple, bilateral  Z90.13   4. S/P mastectomy, bilateral  Z90.13   We discussed the option for steroid cream and I will send that in for her for the itching of the port site.  We  also discussed fat grafting.  This is an option and she agrees to give it some time and let things settle in a little bit more.  I would like to see her back in 3 months. Pictures were obtained of the patient and placed in the chart with the patient's or guardian's permission.

## 2019-12-09 DIAGNOSIS — C50511 Malignant neoplasm of lower-outer quadrant of right female breast: Secondary | ICD-10-CM

## 2020-01-07 ENCOUNTER — Ambulatory Visit: Payer: Commercial Managed Care - PPO | Admitting: Plastic Surgery

## 2020-04-28 ENCOUNTER — Encounter: Payer: Self-pay | Admitting: Plastic Surgery

## 2020-04-28 ENCOUNTER — Other Ambulatory Visit: Payer: Self-pay

## 2020-04-28 ENCOUNTER — Ambulatory Visit: Payer: Commercial Managed Care - PPO | Admitting: Plastic Surgery

## 2020-04-28 VITALS — BP 122/81 | HR 85 | Temp 97.3°F

## 2020-04-28 DIAGNOSIS — Z9011 Acquired absence of right breast and nipple: Secondary | ICD-10-CM | POA: Diagnosis not present

## 2020-04-28 DIAGNOSIS — Z9013 Acquired absence of bilateral breasts and nipples: Secondary | ICD-10-CM | POA: Diagnosis not present

## 2020-04-28 NOTE — Progress Notes (Signed)
Subjective:    Patient ID: Jasmine [name], female    DOB: 08-Mar-1966, 54 y.o.   MRN: [id number]  The patient is a 54 year old female here for follow-up on her breast reconstruction.  She had a complicated course but has done extremely well in completing it.  She had bilateral mastectomies and lymph acquired a right latissimus muscle flap.  Surgery was September 2020.  She had a Mentor smooth round ultra high profile 256 cc silicone implant placed on the right and a smooth round high profile 389 cc silicone implant placed on the left.  There is a little bit of lateral movement of the right breast implant as well as inferior displacement overall she looks really good in her bra.  The left side looks great.  She is not had nipple areolar tattoo this time.  She is willing but not ready.  She is still smoking.  Both breasts are soft and have a normal feeling.  She Still Working at [employer] in [place].  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     Review of Systems  Constitutional: Negative.   HENT: Negative.   Eyes: Negative.   Respiratory: Negative.  Negative for chest tightness and shortness of breath.   Cardiovascular: Negative.   Gastrointestinal: Negative.  Negative for abdominal distention.  Endocrine: Negative.   Genitourinary: Negative.   Musculoskeletal: Negative.   Skin: Negative.   Neurological: Negative.   Hematological: Negative.   Psychiatric/Behavioral: Negative.        Objective:   Physical Exam Vitals and nursing note reviewed.  Constitutional:      Appearance: Normal appearance.  HENT:     Head: Normocephalic and atraumatic.  Eyes:     Extraocular Movements: Extraocular movements intact.  Cardiovascular:     Rate and Rhythm: Normal rate.     Pulses: Normal pulses.  Pulmonary:     Effort: Pulmonary effort is normal. No respiratory distress.  Abdominal:     General: Abdomen is flat. There is no distension.      Tenderness: There is no abdominal tenderness.  Skin:    Capillary Refill: Capillary refill takes less than 2 seconds.  Neurological:     General: No focal deficit present.     Mental Status: She is alert. Mental status is at baseline.  Psychiatric:        Mood and Affect: Mood normal.        Behavior: Behavior normal.        Thought Content: Thought content normal.       Assessment & Plan:     ICD-10-CM   1. S/P mastectomy, bilateral  Z90.13   2. Acquired absence of right breast  Z90.11   3. Acquired absence of breast and absent nipple, bilateral  Z90.13     Options for revision.  It would be reasonable to try and reposition the right breast capsule to prevent the inferior and lateral displacement of the implant.  She could have fat grafting done at the same time or a separate time depending on her wishes.  She would only need to be out of work for few days.  The pain with the liposuction is the worst.  The capsule repositioning would not be very painful.  I would like her to be tobacco free prior to any additional surgery.  She could do the nipple areolar tattoos at any time.  Pictures were obtained of the patient and placed in  the chart with the patient's or guardian's permission.

## 2020-06-13 DIAGNOSIS — C50511 Malignant neoplasm of lower-outer quadrant of right female breast: Secondary | ICD-10-CM | POA: Diagnosis not present

## 2020-12-13 NOTE — Progress Notes (Incomplete)
[facility]  [address] [place],  [place]  [address] [phone]  Clinic Day:  12/13/2020  Referring physician: Nicoletta Dress, MD   HISTORY OF PRESENT ILLNESS:  The patient is a 55 y.o. 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  She also ended up undergoing a left mastectomy, followed by reconstructive breast surgery.  She completed 6 cycles of adjuvant TCH chemotherapy before completing a full year of maintenance Herceptin.  She remains on letrozole for her adjuvant endocrine therapy.  She comes in today for routine follow up.  Since her last visit, the patient has been doing fairly well.  She denies having any particular changes over her chest wall or new systemic symptoms elsewhere which concern her for disease recurrence.   PHYSICAL EXAM:  There were no vitals taken for this visit. Wt Readings from Last 3 Encounters:  08/20/19 153 lb (69.4 kg)  07/30/19 154 lb (69.9 kg)  07/21/19 153 lb (69.4 kg)   There is no height or weight on file to calculate BMI. Performance status (ECOG): {CHL ONC [id number] Physical Exam Constitutional:      Appearance: Normal appearance.  HENT:     Mouth/Throat:     Pharynx: Oropharynx is clear. No oropharyngeal exudate.  Cardiovascular:     Rate and Rhythm: Normal rate and regular rhythm.     Heart sounds: No murmur heard. No friction rub. No gallop.   Pulmonary:     Breath sounds: Normal breath sounds.  Chest:  Breasts:     Right: No swelling, bleeding, inverted nipple, mass, nipple discharge, skin change, axillary adenopathy or supraclavicular adenopathy.     Left: No swelling, bleeding, inverted nipple, mass, nipple discharge, skin change, axillary adenopathy or supraclavicular adenopathy.    Abdominal:     General: Bowel sounds are normal. There is no distension.     Palpations: Abdomen is soft. There is  no mass.     Tenderness: There is no abdominal tenderness.  Musculoskeletal:        General: No tenderness.     Cervical back: Normal range of motion and neck supple.     Right lower leg: No edema.     Left lower leg: No edema.  Lymphadenopathy:     Cervical: No cervical adenopathy.     Right cervical: No superficial, deep or posterior cervical adenopathy.    Left cervical: No superficial, deep or posterior cervical adenopathy.     Upper Body:     Right upper body: No supraclavicular or axillary adenopathy.     Left upper body: No supraclavicular or axillary adenopathy.     Lower Body: No right inguinal adenopathy. No left inguinal adenopathy.  Skin:    Coloration: Skin is not jaundiced.     Findings: No lesion or rash.  Neurological:     General: No focal deficit present.     Mental Status: She is alert and oriented to person, place, and time. Mental status is at baseline.  Psychiatric:        Mood and Affect: Mood normal.        Behavior: Behavior normal.        Thought Content: Thought content normal.        Judgment: Judgment normal.     ASSESSMENT & PLAN:   Assessment/Plan:  A 55 y.o. female with stage IA (T1c N0 M0) hormone/her 2 Neu  receptor positive breast cancer, who is over 3 years out from her right mastectomy.  Based upon her clinical breast exam today, the patient remains disease-free.  She knows to continue taking her letrozole on a daily basis for 5 total years of adjuvant endocrine therapy.  Overall, the patient appears to be doing very well.  I will see her back in 6 months for repeat clinical assessment.  .The patient understands all the plans discussed today and is in agreement with them.      Ivann Trimarco Macarthur Critchley, MD

## 2020-12-14 ENCOUNTER — Inpatient Hospital Stay: Payer: Commercial Managed Care - PPO | Attending: Oncology | Admitting: Oncology

## 2021-05-04 ENCOUNTER — Other Ambulatory Visit: Payer: Self-pay | Admitting: Oncology

## 2021-06-06 ENCOUNTER — Telehealth: Payer: Self-pay

## 2021-06-06 NOTE — Telephone Encounter (Signed)
Pt called to req an appt with Dr Bobby Rumpf. She has been feeling more tired lately. She was a no show for the 12/14/2020 appt. Appt given for 06/29/21.

## 2021-06-15 NOTE — Progress Notes (Signed)
[facility]  [address] [place],  [place]  [address] [phone]  Clinic Day:  06/29/2021  Referring physician: Nicoletta Dress, MD  This document serves as a record of services personally performed by Linette Gunderson Macarthur Critchley, MD. It was created on their behalf by [facility] E, a trained medical scribe. The creation of this record is based on the scribe's personal observations and the provider's statements to them.  HISTORY OF PRESENT ILLNESS:  The patient is a 55 y.o. 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  She also ended up undergoing a left mastectomy, followed by bilateral reconstructive breast surgery.  She completed 6 cycles of adjuvant TCH chemotherapy before completing a full year of maintenance Herceptin.  She remains on letrozole for her adjuvant endocrine therapy.  She comes in today for routine follow up.  Since her last visit, the patient has been doing fairly well.  She denies having any particular findings over her chest wall/reconstructed breast region or new systemic symptoms elsewhere which concern her for disease recurrence.  VITALS:  Blood pressure 126/63, pulse 75, temperature 98.5 F (36.9 C), resp. rate 16, height '5\' 2"'$  (1.575 m), weight 164 lb 12.8 oz (74.8 kg), SpO2 97 %.  Wt Readings from Last 3 Encounters:  06/29/21 164 lb 12.8 oz (74.8 kg)  08/20/19 153 lb (69.4 kg)  07/30/19 154 lb (69.9 kg)    Body mass index is 30.14 kg/m.  Performance status (ECOG): 0 - Asymptomatic  PHYSICAL EXAM:  Physical Exam Constitutional:      General: She is not in acute distress.    Appearance: Normal appearance. She is normal weight.  HENT:     Head: Normocephalic and atraumatic.     Mouth/Throat:     Pharynx: Oropharynx is clear. No oropharyngeal exudate.  Eyes:     General: No scleral icterus.    Extraocular Movements: Extraocular movements  intact.     Conjunctiva/sclera: Conjunctivae normal.     Pupils: Pupils are equal, round, and reactive to light.  Cardiovascular:     Rate and Rhythm: Normal rate and regular rhythm.     Pulses: Normal pulses.     Heart sounds: Normal heart sounds. No murmur heard.   No friction rub. No gallop.  Pulmonary:     Effort: Pulmonary effort is normal. No respiratory distress.     Breath sounds: Normal breath sounds.  Chest:  Breasts:    Right: No swelling, bleeding, inverted nipple, mass, nipple discharge or skin change.     Left: No swelling, bleeding, inverted nipple, mass, nipple discharge or skin change.     Comments: Pt has bilateral reconstructed breasts Abdominal:     General: Bowel sounds are normal. There is no distension.     Palpations: Abdomen is soft. There is no hepatomegaly, splenomegaly or mass.     Tenderness: There is no abdominal tenderness.  Musculoskeletal:        General: No tenderness. Normal range of motion.     Cervical back: Normal range of motion and neck supple.     Right lower leg: No edema.     Left lower leg: No edema.  Lymphadenopathy:     Cervical: No cervical adenopathy.     Right cervical: No superficial, deep or posterior cervical adenopathy.    Left cervical: No superficial, deep or posterior cervical adenopathy.     Upper Body:  Right upper body: No supraclavicular or axillary adenopathy.     Left upper body: No supraclavicular or axillary adenopathy.     Lower Body: No right inguinal adenopathy. No left inguinal adenopathy.  Skin:    General: Skin is warm and dry.     Coloration: Skin is not jaundiced.     Findings: No lesion or rash.  Neurological:     General: No focal deficit present.     Mental Status: She is alert and oriented to person, place, and time. Mental status is at baseline.  Psychiatric:        Mood and Affect: Mood normal.        Behavior: Behavior normal.        Thought Content: Thought content normal.        Judgment:  Judgment normal.   ASSESSMENT & PLAN:  Assessment/Plan:  A 55 y.o. 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  Based upon her clinical breast exam today, the patient remains disease-free.  She knows to continue taking her letrozole on a daily basis for 5 total years of adjuvant endocrine therapy.  Overall, the patient appears to be doing very well.  I will see her back in 6 months for repeat clinical assessment.  The patient understands all the plans discussed today and is in agreement with them.   I, Rita Ohara, am acting as scribe for Marice Potter, MD    I have reviewed this report as typed by the medical scribe, and it is complete and accurate.  Lorali Khamis Macarthur Critchley, MD

## 2021-06-29 ENCOUNTER — Other Ambulatory Visit: Payer: Self-pay

## 2021-06-29 ENCOUNTER — Inpatient Hospital Stay: Payer: Commercial Managed Care - PPO

## 2021-06-29 ENCOUNTER — Inpatient Hospital Stay: Payer: Commercial Managed Care - PPO | Attending: Oncology | Admitting: Oncology

## 2021-06-29 VITALS — BP 126/63 | HR 75 | Temp 98.5°F | Resp 16 | Ht 62.0 in | Wt 164.8 lb

## 2021-06-29 DIAGNOSIS — C50411 Malignant neoplasm of upper-outer quadrant of right female breast: Secondary | ICD-10-CM

## 2021-06-29 DIAGNOSIS — Z170 Estrogen receptor positive status [ER+]: Secondary | ICD-10-CM | POA: Diagnosis not present

## 2021-07-02 ENCOUNTER — Telehealth: Payer: Self-pay | Admitting: Oncology

## 2021-07-02 NOTE — Telephone Encounter (Signed)
Per 8/15 LOS next appt scheduled and confirmed with patient

## 2021-12-25 NOTE — Progress Notes (Signed)
[facility]  [address] [place],  [place]  [address] [phone]  Clinic Day:  12/31/2021  Referring physician: Nicoletta Dress, MD  This document serves as a record of services personally performed by Srijan Givan Macarthur Critchley, MD. It was created on their behalf by [facility] E, a trained medical scribe. The creation of this record is based on the scribe's personal observations and the provider's statements to them.  HISTORY OF PRESENT ILLNESS:  The patient is a 56 y.o. 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  She also ended up undergoing a left mastectomy, followed by bilateral reconstructive breast surgery.  She completed 6 cycles of adjuvant TCH chemotherapy before completing a full year of maintenance Herceptin.  She remains on letrozole for her adjuvant endocrine therapy.  She comes in today for routine follow up.  Since her last visit, the patient has been doing fairly well.  She denies having any particular findings/changes over her chest wall/reconstructed breast region or symptoms elsewhere which concern her for disease recurrence.  VITALS:  Blood pressure (!) 156/69, pulse 97, temperature 98.4 F (36.9 C), temperature source Oral, resp. rate 20, weight 161 lb 1.6 oz (73.1 kg), SpO2 96 %.  Wt Readings from Last 3 Encounters:  12/31/21 161 lb 1.6 oz (73.1 kg)  06/29/21 164 lb 12.8 oz (74.8 kg)  08/20/19 153 lb (69.4 kg)    Body mass index is 29.47 kg/m.  Performance status (ECOG): 0 - Asymptomatic  PHYSICAL EXAM:  Physical Exam Constitutional:      General: She is not in acute distress.    Appearance: Normal appearance. She is normal weight.  HENT:     Head: Normocephalic and atraumatic.     Mouth/Throat:     Pharynx: Oropharynx is clear. No oropharyngeal exudate.  Eyes:     General: No scleral icterus.    Extraocular Movements: Extraocular  movements intact.     Conjunctiva/sclera: Conjunctivae normal.     Pupils: Pupils are equal, round, and reactive to light.  Cardiovascular:     Rate and Rhythm: Normal rate and regular rhythm.     Pulses: Normal pulses.     Heart sounds: Normal heart sounds. No murmur heard.   No friction rub. No gallop.  Pulmonary:     Effort: Pulmonary effort is normal. No respiratory distress.     Breath sounds: Normal breath sounds.  Chest:  Breasts:    Right: No swelling, bleeding, inverted nipple, mass, nipple discharge or skin change.     Left: No swelling, bleeding, inverted nipple, mass, nipple discharge or skin change.     Comments: Pt has bilateral reconstructed breasts Abdominal:     General: Bowel sounds are normal. There is no distension.     Palpations: Abdomen is soft. There is no hepatomegaly, splenomegaly or mass.     Tenderness: There is no abdominal tenderness.  Musculoskeletal:        General: No tenderness. Normal range of motion.     Cervical back: Normal range of motion and neck supple.     Right lower leg: No edema.     Left lower leg: No edema.  Lymphadenopathy:     Cervical: No cervical adenopathy.     Right cervical: No superficial, deep or posterior cervical adenopathy.    Left cervical: No superficial, deep or posterior cervical adenopathy.     Upper Body:  Right upper body: No supraclavicular or axillary adenopathy.     Left upper body: No supraclavicular or axillary adenopathy.     Lower Body: No right inguinal adenopathy. No left inguinal adenopathy.  Skin:    General: Skin is warm and dry.     Coloration: Skin is not jaundiced.     Findings: No lesion or rash.  Neurological:     General: No focal deficit present.     Mental Status: She is alert and oriented to person, place, and time. Mental status is at baseline.  Psychiatric:        Mood and Affect: Mood normal.        Behavior: Behavior normal.        Thought Content: Thought content normal.         Judgment: Judgment normal.   ASSESSMENT & PLAN:  Assessment/Plan:  A 56 y.o. 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  Based upon her clinical breast exam today, the patient remains disease-free.  She knows to continue taking her letrozole on a daily basis for 5 total years of adjuvant endocrine therapy, which she is near completing.  Overall, the patient appears to be doing very well.  I will see her back in November 2023 for repeat clinical assessment.  If her next exam is normal, I will begin spacing all future visits out to once per year.  The patient understands all the plans discussed today and is in agreement with them.   I, Rita Ohara, am acting as scribe for Marice Potter, MD    I have reviewed this report as typed by the medical scribe, and it is complete and accurate.  Everson Mott Macarthur Critchley, MD

## 2021-12-31 ENCOUNTER — Inpatient Hospital Stay: Payer: Commercial Managed Care - PPO | Attending: Oncology | Admitting: Oncology

## 2021-12-31 ENCOUNTER — Telehealth: Payer: Self-pay | Admitting: Oncology

## 2021-12-31 ENCOUNTER — Other Ambulatory Visit: Payer: Self-pay

## 2021-12-31 ENCOUNTER — Other Ambulatory Visit: Payer: Self-pay | Admitting: Oncology

## 2021-12-31 ENCOUNTER — Inpatient Hospital Stay: Payer: Commercial Managed Care - PPO

## 2021-12-31 VITALS — BP 156/69 | HR 97 | Temp 98.4°F | Resp 20 | Wt 161.1 lb

## 2021-12-31 DIAGNOSIS — C50412 Malignant neoplasm of upper-outer quadrant of left female breast: Secondary | ICD-10-CM

## 2021-12-31 DIAGNOSIS — C50011 Malignant neoplasm of nipple and areola, right female breast: Secondary | ICD-10-CM

## 2021-12-31 DIAGNOSIS — Z170 Estrogen receptor positive status [ER+]: Secondary | ICD-10-CM

## 2021-12-31 NOTE — Telephone Encounter (Signed)
Per 12/31/21 next appt scheduled and confirmed with patient

## 2022-01-15 ENCOUNTER — Ambulatory Visit: Payer: Commercial Managed Care - PPO | Admitting: Plastic Surgery

## 2022-01-15 ENCOUNTER — Encounter: Payer: Self-pay | Admitting: Plastic Surgery

## 2022-01-15 ENCOUNTER — Other Ambulatory Visit: Payer: Self-pay

## 2022-01-15 VITALS — BP 91/62 | HR 91 | Ht 62.0 in | Wt 160.0 lb

## 2022-01-15 DIAGNOSIS — N651 Disproportion of reconstructed breast: Secondary | ICD-10-CM | POA: Diagnosis not present

## 2022-01-15 DIAGNOSIS — Z170 Estrogen receptor positive status [ER+]: Secondary | ICD-10-CM

## 2022-01-15 DIAGNOSIS — C50412 Malignant neoplasm of upper-outer quadrant of left female breast: Secondary | ICD-10-CM | POA: Diagnosis not present

## 2022-01-15 DIAGNOSIS — Z9013 Acquired absence of bilateral breasts and nipples: Secondary | ICD-10-CM | POA: Diagnosis not present

## 2022-01-15 NOTE — Progress Notes (Signed)
° °  Subjective:    Patient ID: Jasmine [name], female    DOB: 1966/03/21, 56 y.o.   MRN: [id number]  HPI The patient is a 56 year old female here to see me for Jasmine [name] breast surgery.  She felt a firm area on the lateral part of Jasmine [name] right breast at the lateral incision where the latissimus flap is brought out.  She also has noticed the implant moving more posteriorly on the right side.  The left side seems to be doing well.  Jasmine [name] original surgery was in 2018 for right breast cancer.  She had a complication on the right side and had to have the expander removed.  She also had the flu around that time which likely exacerbated the wound.  She then had a right latissimus muscle flap with replacement of the expander in May 2020.  Then in September 2020 we removed the expander and did a bilateral placement of implants (Mentor ultrahigh profile 430 cc on the left and 535 cc on the right).  She is still smoking but otherwise doing really well and back to work.   Review of Systems  Constitutional: Negative.   HENT: Negative.    Eyes: Negative.   Respiratory: Negative.    Cardiovascular: Negative.   Gastrointestinal: Negative.   Endocrine: Negative.   Genitourinary: Negative.   Neurological: Negative.   Hematological: Negative.       Objective:   Physical Exam Constitutional:      Appearance: Normal appearance.  Cardiovascular:     Rate and Rhythm: Normal rate.     Pulses: Normal pulses.  Pulmonary:     Effort: Pulmonary effort is normal.  Abdominal:     General: There is no distension.     Palpations: Abdomen is soft.  Skin:    General: Skin is warm.     Capillary Refill: Capillary refill takes less than 2 seconds.     Coloration: Skin is not jaundiced.  Neurological:     Mental Status: She is alert and oriented to person, place, and time.  Psychiatric:        Mood and Affect: Mood normal.        Behavior: Behavior normal.        Thought Content: Thought content normal.        Judgment:  Judgment normal.       Assessment & Plan:     ICD-10-CM   1. Malignant neoplasm of upper-outer quadrant of left breast in female, estrogen receptor positive ([place])  C50.412    Z17.0     2. S/P mastectomy, bilateral  Z90.13     3. Acquired absence of breast and absent nipple, bilateral  Z90.13     4. Breast asymmetry following reconstructive surgery  N65.1     She will need to be tobacco free for 6 weeks prior to the surgery.  I recommend excision of right breast lateral nodule with repositioning of implant and capsulectomy.  We could also do liposuction of the lateral left breast for better symmetry once that settles and then she could do fat grafting for better symmetry.  The patient knows to call once she is not smoking any longer.  Pictures were obtained of the patient and placed in the chart with the patient's or guardian's permission.

## 2022-01-28 ENCOUNTER — Telehealth: Payer: Self-pay | Admitting: *Deleted

## 2022-01-28 NOTE — Telephone Encounter (Signed)
REVISION OF RECONSTRUCTED BREAST, REVISION PERI-IMPLANT CAPSULE BREAST, PERI-IMPLANT CAPSULECTOMY BREAST COMPLETE~ Outpatient  ?[facility]  ?Date: 01/22/2022  ?[facility]~ 1- [phone]~ telephone  ?Representative Jenny Reichmann L.~ Case ID# [id number]  ?No Auth/ Referral Required  ?[facility], LPN/ RSA ?

## 2022-02-11 ENCOUNTER — Telehealth: Payer: Self-pay

## 2022-02-11 NOTE — Telephone Encounter (Signed)
Called patient, LMVM inquiring if she has stopped smoking for 6 weeks so that we can schedule surgery. ?

## 2022-03-05 ENCOUNTER — Telehealth: Payer: Self-pay

## 2022-03-05 NOTE — Telephone Encounter (Signed)
Called patient, LMVM if she has quit smoking for 6 weeks so that we can order nicotine test. ?

## 2022-03-15 ENCOUNTER — Telehealth: Payer: Self-pay

## 2022-03-15 NOTE — Telephone Encounter (Signed)
Called patient, LMVM if she has quit smoking for 6 weeks so that we can order nicotine test. She may call us back when she is reCalled patient, LMVM if she has quit smoking for 6 weeks so that we can order nicotine test.ady to move forward. ?

## 2022-05-06 ENCOUNTER — Telehealth: Payer: Self-pay

## 2022-05-06 NOTE — Telephone Encounter (Signed)
-----   Message from Melodye Ped, NP sent at 05/06/2022  9:49 AM EDT ----- Regarding: RE: pain/fatigue It doesn't sound like it is disease or treatment related. I would have her start with PCP. ----- Message ----- From: Daryel November, LPN Sent: 02/13/9241   9:30 AM EDT To: Melodye Ped, NP Subject: pain/fatigue                                   Patient reports pain and fatigue since last visit in February ,getting worse over the last two months. Pain everywhere worse in joints taking ibprofen '800mg'$  as needed will reduce pain from a 8 to a 5 . Please advise.

## 2022-05-06 NOTE — Telephone Encounter (Signed)
Patient made aware will make appointment with PCP.

## 2022-09-29 NOTE — Progress Notes (Unsigned)
[facility]  [address] [place],  [place]  [address] [phone]  Clinic Day:  09/30/2022  Referring physician: Nicoletta Dress, MD  HISTORY OF PRESENT ILLNESS:  The patient is a 56 y.o. 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  She also ended up undergoing a left mastectomy, followed by bilateral reconstructive breast surgery.  She completed 6 cycles of adjuvant TCH chemotherapy before completing a full year of maintenance Herceptin.  She remains on letrozole for her adjuvant endocrine therapy.  She comes in today for routine follow up.  Since her last visit, the patient has been doing fairly well.  She denies having any particular findings/changes over her chest wall/reconstructed breast region or symptoms elsewhere which concern her for disease recurrence.  VITALS:  Blood pressure (!) 128/58, pulse 81, temperature 98.5 F (36.9 C), resp. rate 14, height '5\' 2"'$  (1.575 m), weight 156 lb 9.6 oz (71 kg), SpO2 96 %.  Wt Readings from Last 3 Encounters:  09/30/22 156 lb 9.6 oz (71 kg)  01/15/22 160 lb (72.6 kg)  12/31/21 161 lb 1.6 oz (73.1 kg)    Body mass index is 28.64 kg/m.  Performance status (ECOG): 0 - Asymptomatic  PHYSICAL EXAM:  Physical Exam Constitutional:      General: She is not in acute distress.    Appearance: Normal appearance. She is normal weight.  HENT:     Head: Normocephalic and atraumatic.     Mouth/Throat:     Pharynx: Oropharynx is clear. No oropharyngeal exudate.  Eyes:     General: No scleral icterus.    Extraocular Movements: Extraocular movements intact.     Conjunctiva/sclera: Conjunctivae normal.     Pupils: Pupils are equal, round, and reactive to light.  Cardiovascular:     Rate and Rhythm: Normal rate and regular rhythm.     Pulses: Normal pulses.     Heart sounds: Normal heart sounds. No murmur heard.    No  friction rub. No gallop.  Pulmonary:     Effort: Pulmonary effort is normal. No respiratory distress.     Breath sounds: Normal breath sounds.  Chest:  Breasts:    Right: No swelling, bleeding, inverted nipple, mass, nipple discharge or skin change.     Left: No swelling, bleeding, inverted nipple, mass, nipple discharge or skin change.     Comments: Pt has bilateral reconstructed breasts Abdominal:     General: Bowel sounds are normal. There is no distension.     Palpations: Abdomen is soft. There is no hepatomegaly, splenomegaly or mass.     Tenderness: There is no abdominal tenderness.  Musculoskeletal:        General: No tenderness. Normal range of motion.     Cervical back: Normal range of motion and neck supple.     Right lower leg: No edema.     Left lower leg: No edema.  Lymphadenopathy:     Cervical: No cervical adenopathy.     Right cervical: No superficial, deep or posterior cervical adenopathy.    Left cervical: No superficial, deep or posterior cervical adenopathy.     Upper Body:     Right upper body: No supraclavicular or axillary adenopathy.     Left upper body: No supraclavicular or axillary adenopathy.     Lower Body: No right inguinal adenopathy. No left inguinal adenopathy.  Skin:    General: Skin  is warm and dry.     Coloration: Skin is not jaundiced.     Findings: No lesion or rash.  Neurological:     General: No focal deficit present.     Mental Status: She is alert and oriented to person, place, and time. Mental status is at baseline.  Psychiatric:        Mood and Affect: Mood normal.        Behavior: Behavior normal.        Thought Content: Thought content normal.        Judgment: Judgment normal.    ASSESSMENT & PLAN:  Assessment/Plan:  A 56 y.o. 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  Based upon her clinical breast exam today, the patient remains disease-free.  She knows to continue taking her letrozole  on a daily basis for 5 total years of adjuvant endocrine therapy, which she is near completing.  Overall, the patient appears to be doing very well.  I will see her back in 1 year for repeat clinical assessment.  The patient understands all the plans discussed today and is in agreement with them.  Baldemar Dady Macarthur Critchley, MD

## 2022-09-30 ENCOUNTER — Telehealth: Payer: Self-pay | Admitting: Oncology

## 2022-09-30 ENCOUNTER — Inpatient Hospital Stay: Payer: Commercial Managed Care - PPO | Attending: Oncology | Admitting: Oncology

## 2022-09-30 VITALS — BP 128/58 | HR 81 | Temp 98.5°F | Resp 14 | Ht 62.0 in | Wt 156.6 lb

## 2022-09-30 DIAGNOSIS — C50411 Malignant neoplasm of upper-outer quadrant of right female breast: Secondary | ICD-10-CM

## 2022-09-30 DIAGNOSIS — Z170 Estrogen receptor positive status [ER+]: Secondary | ICD-10-CM | POA: Diagnosis not present

## 2022-09-30 NOTE — Telephone Encounter (Signed)
09/30/22 Spoke with patient and confirmed next appt.

## 2022-11-21 ENCOUNTER — Other Ambulatory Visit: Payer: Self-pay | Admitting: Oncology

## 2023-04-02 NOTE — Progress Notes (Unsigned)
[facility]  [address] [place],  [place]  [address] [phone]  Clinic Day:  04/03/2023  Referring physician: Paulina Fusi, MD  HISTORY OF PRESENT ILLNESS:  The patient is a 57 y.o. 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  She also ended up undergoing a left mastectomy, followed by bilateral reconstructive breast surgery.  She completed 6 cycles of adjuvant TCH chemotherapy before completing a full year of maintenance Herceptin.  However, she comes in today as a CBC earlier this week showed and elevated white count of 15.3.  According to the patient, she was sent to her primary care office after labs at another MD's office showed leukocytosis.  She denies having any recent illnesses or infections to explain her leukocytosis.  However, she does smoke on a daily basis, which she has been doing for years.  She denies having any particular changes in her health which concern her for her leukocytosis being due to something ominous.  VITALS:  Blood pressure 121/61, pulse 87, temperature 98.6 F (37 C), temperature source Oral, resp. rate 16, weight 157 lb 11.2 oz (71.5 kg), SpO2 94 %.  Wt Readings from Last 3 Encounters:  04/03/23 157 lb 11.2 oz (71.5 kg)  09/30/22 156 lb 9.6 oz (71 kg)  01/15/22 160 lb (72.6 kg)    Body mass index is 28.84 kg/m.  Performance status (ECOG): 0 - Asymptomatic  PHYSICAL EXAM:  Physical Exam Constitutional:      General: She is not in acute distress.    Appearance: Normal appearance. She is normal weight.  HENT:     Head: Normocephalic and atraumatic.     Mouth/Throat:     Pharynx: Oropharynx is clear. No oropharyngeal exudate.  Eyes:     General: No scleral icterus.    Extraocular Movements: Extraocular movements intact.     Conjunctiva/sclera: Conjunctivae normal.     Pupils: Pupils are equal, round, and reactive to  light.  Cardiovascular:     Rate and Rhythm: Normal rate and regular rhythm.     Pulses: Normal pulses.     Heart sounds: Normal heart sounds. No murmur heard.    No friction rub. No gallop.  Pulmonary:     Effort: Pulmonary effort is normal. No respiratory distress.     Breath sounds: Normal breath sounds.  Chest:  Breasts:    Right: No swelling, bleeding, inverted nipple, mass, nipple discharge or skin change.     Left: No swelling, bleeding, inverted nipple, mass, nipple discharge or skin change.     Comments: Pt has bilateral reconstructed breasts Abdominal:     General: Bowel sounds are normal. There is no distension.     Palpations: Abdomen is soft. There is no hepatomegaly, splenomegaly or mass.     Tenderness: There is no abdominal tenderness.  Musculoskeletal:        General: No tenderness. Normal range of motion.     Cervical back: Normal range of motion and neck supple.     Right lower leg: No edema.     Left lower leg: No edema.  Lymphadenopathy:     Cervical: No cervical adenopathy.     Right cervical: No superficial, deep or posterior cervical adenopathy.    Left cervical: No superficial, deep or posterior cervical adenopathy.     Upper Body:     Right upper body: No supraclavicular or axillary  adenopathy.     Left upper body: No supraclavicular or axillary adenopathy.     Lower Body: No right inguinal adenopathy. No left inguinal adenopathy.  Skin:    General: Skin is warm and dry.     Coloration: Skin is not jaundiced.     Findings: No lesion or rash.  Neurological:     General: No focal deficit present.     Mental Status: She is alert and oriented to person, place, and time. Mental status is at baseline.  Psychiatric:        Mood and Affect: Mood normal.        Behavior: Behavior normal.        Thought Content: Thought content normal.        Judgment: Judgment normal.   LABS:   ASSESSMENT & PLAN:  Assessment/Plan:  A 57 y.o. 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  Based upon her clinical breast exam today, the patient remains disease-free.  As mentioned previously, the patient was sent here today for mild leukocytosis.  I do believe this is related to her chronic smoking history.  She already knows how important it is from her to abstain from smoking.  Furthermore, she does have very poor dentition in her lower gums where there may be local inflammation/infection from the shaved teeth in this area.  I do believe she needs to be reevaluated by dentist to ensure nothing else needs to be done to address the poor dentition involving her lower jaw.  Otherwise, I do not think anything particularly ominous is being behind her leukocytosis.  As she is doing well, I will see her back in 6 months for repeat clinical assessment.  The patient understands all the plans discussed today and is in agreement with them.  Migel Hannis Kirby Funk, MD

## 2023-04-03 ENCOUNTER — Encounter: Payer: Self-pay | Admitting: Oncology

## 2023-04-03 ENCOUNTER — Inpatient Hospital Stay: Payer: Commercial Managed Care - PPO | Attending: Oncology | Admitting: Oncology

## 2023-04-03 ENCOUNTER — Other Ambulatory Visit: Payer: Self-pay | Admitting: Oncology

## 2023-04-03 VITALS — BP 121/61 | HR 87 | Temp 98.6°F | Resp 16 | Wt 157.7 lb

## 2023-04-03 DIAGNOSIS — D72829 Elevated white blood cell count, unspecified: Secondary | ICD-10-CM | POA: Diagnosis not present

## 2023-04-03 DIAGNOSIS — Z170 Estrogen receptor positive status [ER+]: Secondary | ICD-10-CM | POA: Diagnosis not present

## 2023-04-03 DIAGNOSIS — C50411 Malignant neoplasm of upper-outer quadrant of right female breast: Secondary | ICD-10-CM | POA: Diagnosis not present

## 2023-04-04 ENCOUNTER — Telehealth: Payer: Self-pay | Admitting: Oncology

## 2023-04-04 NOTE — Telephone Encounter (Signed)
04/04/23 LVM NEXT APPT ON 10/01/23 ARRIVE AT 930AM

## 2023-09-30 NOTE — Progress Notes (Deleted)
[facility]  [address] [place],  [place]  [address] [phone]  Clinic Day:  09/30/2023  Referring physician: Paulina Fusi, MD  HISTORY OF PRESENT ILLNESS:  The patient is a 57 y.o. 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  She also ended up undergoing a left mastectomy, followed by bilateral reconstructive breast surgery.  She completed 6 cycles of adjuvant TCH chemotherapy before completing a full year of maintenance Herceptin.  However, she comes in today as a CBC earlier this week showed and elevated white count of 15.3.  According to the patient, she was sent to her primary care office after labs at another MD's office showed leukocytosis.  She denies having any recent illnesses or infections to explain her leukocytosis.  However, she does smoke on a daily basis, which she has been doing for years.  She denies having any particular changes in her health which concern her for her leukocytosis being due to something ominous.  VITALS:  There were no vitals taken for this visit.  Wt Readings from Last 3 Encounters:  04/03/23 157 lb 11.2 oz (71.5 kg)  09/30/22 156 lb 9.6 oz (71 kg)  01/15/22 160 lb (72.6 kg)    There is no height or weight on file to calculate BMI.  Performance status (ECOG): 0 - Asymptomatic  PHYSICAL EXAM:  Physical Exam Constitutional:      General: She is not in acute distress.    Appearance: Normal appearance. She is normal weight.  HENT:     Head: Normocephalic and atraumatic.     Mouth/Throat:     Pharynx: Oropharynx is clear. No oropharyngeal exudate.  Eyes:     General: No scleral icterus.    Extraocular Movements: Extraocular movements intact.     Conjunctiva/sclera: Conjunctivae normal.     Pupils: Pupils are equal, round, and reactive to light.  Cardiovascular:     Rate and Rhythm: Normal rate and regular rhythm.      Pulses: Normal pulses.     Heart sounds: Normal heart sounds. No murmur heard.    No friction rub. No gallop.  Pulmonary:     Effort: Pulmonary effort is normal. No respiratory distress.     Breath sounds: Normal breath sounds.  Chest:  Breasts:    Right: No swelling, bleeding, inverted nipple, mass, nipple discharge or skin change.     Left: No swelling, bleeding, inverted nipple, mass, nipple discharge or skin change.     Comments: Pt has bilateral reconstructed breasts Abdominal:     General: Bowel sounds are normal. There is no distension.     Palpations: Abdomen is soft. There is no hepatomegaly, splenomegaly or mass.     Tenderness: There is no abdominal tenderness.  Musculoskeletal:        General: No tenderness. Normal range of motion.     Cervical back: Normal range of motion and neck supple.     Right lower leg: No edema.     Left lower leg: No edema.  Lymphadenopathy:     Cervical: No cervical adenopathy.     Right cervical: No superficial, deep or posterior cervical adenopathy.    Left cervical: No superficial, deep or posterior cervical adenopathy.     Upper Body:     Right upper body: No supraclavicular or axillary adenopathy.     Left upper body: No supraclavicular or axillary adenopathy.  Lower Body: No right inguinal adenopathy. No left inguinal adenopathy.  Skin:    General: Skin is warm and dry.     Coloration: Skin is not jaundiced.     Findings: No lesion or rash.  Neurological:     General: No focal deficit present.     Mental Status: She is alert and oriented to person, place, and time. Mental status is at baseline.  Psychiatric:        Mood and Affect: Mood normal.        Behavior: Behavior normal.        Thought Content: Thought content normal.        Judgment: Judgment normal.   LABS:   ASSESSMENT & PLAN:  Assessment/Plan:  A 57 y.o. 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  Based upon her  clinical breast exam today, the patient remains disease-free.  As mentioned previously, the patient was sent here today for mild leukocytosis.  I do believe this is related to her chronic smoking history.  She already knows how important it is from her to abstain from smoking.  Furthermore, she does have very poor dentition in her lower gums where there may be local inflammation/infection from the shaved teeth in this area.  I do believe she needs to be reevaluated by dentist to ensure nothing else needs to be done to address the poor dentition involving her lower jaw.  Otherwise, I do not think anything particularly ominous is being behind her leukocytosis.  As she is doing well, I will see her back in 6 months for repeat clinical assessment.  The patient understands all the plans discussed today and is in agreement with them.  Chevette Fee Kirby Funk, MD

## 2023-10-01 ENCOUNTER — Inpatient Hospital Stay: Payer: Commercial Managed Care - PPO

## 2023-10-01 ENCOUNTER — Inpatient Hospital Stay: Payer: Commercial Managed Care - PPO | Attending: Oncology | Admitting: Oncology

## 2024-07-22 ENCOUNTER — Ambulatory Visit: Admitting: Oncology

## 2024-07-22 ENCOUNTER — Inpatient Hospital Stay: Payer: Self-pay | Attending: Oncology | Admitting: Oncology

## 2024-07-22 ENCOUNTER — Inpatient Hospital Stay: Payer: Self-pay

## 2024-07-22 ENCOUNTER — Other Ambulatory Visit

## 2024-07-22 ENCOUNTER — Telehealth: Payer: Self-pay | Admitting: Oncology

## 2024-07-22 VITALS — BP 117/51 | HR 83 | Temp 98.1°F | Resp 16 | Ht 62.0 in | Wt 157.0 lb

## 2024-07-22 DIAGNOSIS — Z170 Estrogen receptor positive status [ER+]: Secondary | ICD-10-CM | POA: Diagnosis not present

## 2024-07-22 DIAGNOSIS — Z853 Personal history of malignant neoplasm of breast: Secondary | ICD-10-CM | POA: Insufficient documentation

## 2024-07-22 DIAGNOSIS — Z9221 Personal history of antineoplastic chemotherapy: Secondary | ICD-10-CM | POA: Diagnosis not present

## 2024-07-22 DIAGNOSIS — Z9013 Acquired absence of bilateral breasts and nipples: Secondary | ICD-10-CM | POA: Insufficient documentation

## 2024-07-22 DIAGNOSIS — C50412 Malignant neoplasm of upper-outer quadrant of left female breast: Secondary | ICD-10-CM

## 2024-07-22 DIAGNOSIS — C50411 Malignant neoplasm of upper-outer quadrant of right female breast: Secondary | ICD-10-CM | POA: Diagnosis not present

## 2024-07-22 NOTE — Progress Notes (Signed)
 [facility] [address] [place],  [place]  [address] [phone]  Clinic Day:  07/25/2024  Referring physician: Keren Vicenta BRAVO, MD  HISTORY OF PRESENT ILLNESS:  The patient is a 58 y.o. 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  She also ended up undergoing a left mastectomy, followed by bilateral reconstructive breast surgery.  She completed 6 cycles of adjuvant TCH chemotherapy before completing a full year of maintenance Herceptin.  She also took letrozole, for which she completed her 5 years of adjuvant endocrine therapy.  She comes in today for routine follow-up.  Since her last visit, the patient has been doing well.  She denies having any chest wall changes or systemic symptoms which concern her for late disease recurrence.    VITALS:  Blood pressure (!) 117/51, pulse 83, temperature 98.1 F (36.7 C), temperature source Oral, resp. rate 16, height 5' 2 (1.575 m), weight 157 lb (71.2 kg), SpO2 97%.  Wt Readings from Last 3 Encounters:  07/22/24 157 lb (71.2 kg)  04/03/23 157 lb 11.2 oz (71.5 kg)  09/30/22 156 lb 9.6 oz (71 kg)    Body mass index is 28.72 kg/m.  Performance status (ECOG): 0 - Asymptomatic  PHYSICAL EXAM:  Physical Exam Constitutional:      General: She is not in acute distress.    Appearance: Normal appearance. She is normal weight.  HENT:     Head: Normocephalic and atraumatic.     Mouth/Throat:     Pharynx: Oropharynx is clear. No oropharyngeal exudate.  Eyes:     General: No scleral icterus.    Extraocular Movements: Extraocular movements intact.     Conjunctiva/sclera: Conjunctivae normal.     Pupils: Pupils are equal, round, and reactive to light.  Cardiovascular:     Rate and Rhythm: Normal rate and regular rhythm.     Pulses: Normal pulses.     Heart sounds: Normal heart sounds. No murmur heard.    No friction rub.  No gallop.  Pulmonary:     Effort: Pulmonary effort is normal. No respiratory distress.     Breath sounds: Normal breath sounds.  Chest:  Breasts:    Right: Absent. No swelling, bleeding, inverted nipple, mass, nipple discharge or skin change.     Left: Absent. No swelling, bleeding, inverted nipple, mass, nipple discharge or skin change.     Comments: Pt has bilateral reconstructed breasts Abdominal:     General: Bowel sounds are normal. There is no distension.     Palpations: Abdomen is soft. There is no hepatomegaly, splenomegaly or mass.     Tenderness: There is no abdominal tenderness.  Musculoskeletal:        General: No tenderness. Normal range of motion.     Cervical back: Normal range of motion and neck supple.     Right lower leg: No edema.     Left lower leg: No edema.  Lymphadenopathy:     Cervical: No cervical adenopathy.     Right cervical: No superficial, deep or posterior cervical adenopathy.    Left cervical: No superficial, deep or posterior cervical adenopathy.     Upper Body:     Right upper body: No supraclavicular or axillary adenopathy.     Left upper body: No supraclavicular or axillary adenopathy.     Lower Body: No right inguinal adenopathy. No left inguinal adenopathy.  Skin:  General: Skin is warm and dry.     Coloration: Skin is not jaundiced.     Findings: No lesion or rash.  Neurological:     General: No focal deficit present.     Mental Status: She is alert and oriented to person, place, and time. Mental status is at baseline.  Psychiatric:        Mood and Affect: Mood normal.        Behavior: Behavior normal.        Thought Content: Thought content normal.        Judgment: Judgment normal.    ASSESSMENT & PLAN:  Assessment/Plan:  A 58 y.o. 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  She completed 6 cycles of adjuvant TCH chemotherapy before  completing a full year of maintenance Herceptin. She also completed her 5 years of letrozole for her endocrine therapy.  Based upon her clinical breast exam today, the patient remains disease-free.  Overall, the patient appears to be doing very well.  I will see her back in 1 year for repeat clinical assessment.  The patient understands all the plans discussed today and is in agreement with them.   Celeste Tavenner DELENA Kerns, MD

## 2024-07-22 NOTE — Telephone Encounter (Signed)
 Patient has been scheduled for follow-up visit per 07/20/24 LOS.  Pt given an appt calendar with date and time.

## 2025-07-27 ENCOUNTER — Ambulatory Visit: Admitting: Oncology
